# Patient Record
Sex: Female | Born: 1955 | Marital: Single
Health system: Southern US, Community
[De-identification: ages and names within clinical notes are randomized; demographics above are authoritative.]

---

## 2013-03-09 ENCOUNTER — Emergency Department: Payer: Self-pay

## 2013-03-10 LAB — COMPREHENSIVE METABOLIC PANEL
Albumin: 3.8 g/dL (ref 3.4–5.0)
Calcium, Total: 9.4 mg/dL (ref 8.5–10.1)
Chloride: 95 mmol/L — ABNORMAL LOW (ref 98–107)
Co2: 24 mmol/L (ref 21–32)
EGFR (African American): >60
Osmolality: 261 (ref 275–301)
Potassium: 4 mmol/L (ref 3.5–5.1)
SGPT (ALT): 22 U/L (ref 12–78)
Sodium: 129 mmol/L — ABNORMAL LOW (ref 136–145)
Total Protein: 7.6 g/dL (ref 6.4–8.2)

## 2013-03-10 LAB — CBC
HCT: 39 % (ref 35.0–47.0)
MCHC: 35.6 g/dL (ref 32.0–36.0)
MCV: 98 fL (ref 80–100)
RDW: 12.4 % (ref 11.5–14.5)
WBC: 13.9 10*3/uL — ABNORMAL HIGH (ref 3.6–11.0)

## 2013-03-10 LAB — DRUG SCREEN, URINE
Amphetamines, Ur Screen: NEGATIVE (ref ?–1000)
Benzodiazepine, Ur Scrn: NEGATIVE (ref ?–200)
Cannabinoid 50 Ng, Ur ~~LOC~~: NEGATIVE (ref ?–50)
Methadone, Ur Screen: NEGATIVE (ref ?–300)
Phencyclidine (PCP) Ur S: NEGATIVE (ref ?–25)

## 2013-03-10 LAB — ETHANOL: Ethanol: <3 mg/dL

## 2013-03-10 LAB — SALICYLATE LEVEL: Salicylates, Serum: <1.7 mg/dL

## 2013-03-10 LAB — TSH: Thyroid Stimulating Horm: 2.87 u[IU]/mL

## 2013-03-10 LAB — ACETAMINOPHEN LEVEL: Acetaminophen: <2 ug/mL

## 2013-03-24 ENCOUNTER — Emergency Department: Payer: Self-pay | Admitting: Emergency Medicine

## 2013-04-24 ENCOUNTER — Emergency Department: Payer: Self-pay | Admitting: Emergency Medicine

## 2013-04-24 LAB — COMPREHENSIVE METABOLIC PANEL
Albumin: 3.5 g/dL (ref 3.4–5.0)
Alkaline Phosphatase: 69 U/L (ref 50–136)
Anion Gap: 8 (ref 7–16)
BUN: 7 mg/dL (ref 7–18)
Bilirubin,Total: 0.3 mg/dL (ref 0.2–1.0)
Calcium, Total: 9 mg/dL (ref 8.5–10.1)
Chloride: 108 mmol/L — ABNORMAL HIGH (ref 98–107)
Creatinine: 0.69 mg/dL (ref 0.60–1.30)
EGFR (African American): >60
Osmolality: 284 (ref 275–301)
Potassium: 3.1 mmol/L — ABNORMAL LOW (ref 3.5–5.1)
Sodium: 141 mmol/L (ref 136–145)
Total Protein: 6.7 g/dL (ref 6.4–8.2)

## 2013-04-24 LAB — CBC
HCT: 42.2 % (ref 35.0–47.0)
HGB: 14.8 g/dL (ref 12.0–16.0)
MCH: 34 pg (ref 26.0–34.0)
MCHC: 35 g/dL (ref 32.0–36.0)
MCV: 97 fL (ref 80–100)
Platelet: 218 10*3/uL (ref 150–440)

## 2013-04-24 LAB — URINALYSIS, COMPLETE
Bilirubin,UR: NEGATIVE
Blood: NEGATIVE
Ketone: NEGATIVE
Nitrite: NEGATIVE
Protein: NEGATIVE
RBC,UR: 3 /HPF (ref 0–5)
Specific Gravity: 1.005 (ref 1.003–1.030)

## 2013-04-24 LAB — DRUG SCREEN, URINE
Amphetamines, Ur Screen: NEGATIVE (ref ?–1000)
Barbiturates, Ur Screen: NEGATIVE (ref ?–200)
Cocaine Metabolite,Ur ~~LOC~~: NEGATIVE (ref ?–300)
Phencyclidine (PCP) Ur S: NEGATIVE (ref ?–25)

## 2013-04-24 LAB — VALPROIC ACID LEVEL: Valproic Acid: 46 ug/mL — ABNORMAL LOW

## 2013-04-24 LAB — TSH: Thyroid Stimulating Horm: 0.3 u[IU]/mL — ABNORMAL LOW

## 2013-05-11 LAB — DRUG SCREEN, URINE
Cannabinoid 50 Ng, Ur ~~LOC~~: NEGATIVE (ref ?–50)
Cocaine Metabolite,Ur ~~LOC~~: NEGATIVE (ref ?–300)
MDMA (Ecstasy)Ur Screen: NEGATIVE (ref ?–500)
Methadone, Ur Screen: NEGATIVE (ref ?–300)
Opiate, Ur Screen: NEGATIVE (ref ?–300)
Phencyclidine (PCP) Ur S: NEGATIVE (ref ?–25)
Tricyclic, Ur Screen: NEGATIVE (ref ?–1000)

## 2013-05-11 LAB — URINALYSIS, COMPLETE
Glucose,UR: NEGATIVE mg/dL (ref 0–75)
Ketone: NEGATIVE
Nitrite: NEGATIVE
Ph: 6 (ref 4.5–8.0)
RBC,UR: 3 /HPF (ref 0–5)
Squamous Epithelial: 1
WBC UR: 12 /HPF (ref 0–5)

## 2013-05-11 LAB — CBC
HCT: 43.2 % (ref 35.0–47.0)
MCH: 34 pg (ref 26.0–34.0)
MCHC: 35.2 g/dL (ref 32.0–36.0)
MCV: 97 fL (ref 80–100)
RBC: 4.46 10*6/uL (ref 3.80–5.20)

## 2013-05-11 LAB — COMPREHENSIVE METABOLIC PANEL
Alkaline Phosphatase: 68 U/L (ref 50–136)
Anion Gap: 8 (ref 7–16)
BUN: 9 mg/dL (ref 7–18)
Calcium, Total: 9.2 mg/dL (ref 8.5–10.1)
Chloride: 107 mmol/L (ref 98–107)
Creatinine: 0.95 mg/dL (ref 0.60–1.30)
Glucose: 237 mg/dL — ABNORMAL HIGH (ref 65–99)
Potassium: 3.5 mmol/L (ref 3.5–5.1)
SGOT(AST): 21 U/L (ref 15–37)
Total Protein: 6.9 g/dL (ref 6.4–8.2)

## 2013-05-11 LAB — ETHANOL: Ethanol: <3 mg/dL

## 2013-05-13 ENCOUNTER — Inpatient Hospital Stay: Payer: Self-pay | Admitting: Psychiatry

## 2013-05-23 ENCOUNTER — Emergency Department: Payer: Self-pay | Admitting: Emergency Medicine

## 2013-05-23 LAB — CBC
HCT: 39.5 % (ref 35.0–47.0)
MCH: 34.2 pg — ABNORMAL HIGH (ref 26.0–34.0)
MCHC: 35.8 g/dL (ref 32.0–36.0)
MCV: 96 fL (ref 80–100)
RBC: 4.14 10*6/uL (ref 3.80–5.20)
WBC: 10.6 10*3/uL (ref 3.6–11.0)

## 2013-05-24 LAB — COMPREHENSIVE METABOLIC PANEL
Albumin: 3.6 g/dL (ref 3.4–5.0)
Alkaline Phosphatase: 233 U/L — ABNORMAL HIGH (ref 50–136)
Anion Gap: 10 (ref 7–16)
Bilirubin,Total: 0.2 mg/dL (ref 0.2–1.0)
Calcium, Total: 9.4 mg/dL (ref 8.5–10.1)
Chloride: 106 mmol/L (ref 98–107)
Co2: 20 mmol/L — ABNORMAL LOW (ref 21–32)
Creatinine: 1.05 mg/dL (ref 0.60–1.30)
EGFR (Non-African Amer.): 58 — ABNORMAL LOW
Potassium: 4.1 mmol/L (ref 3.5–5.1)

## 2013-05-24 LAB — ACETAMINOPHEN LEVEL: Acetaminophen: <2 ug/mL

## 2013-05-24 LAB — DRUG SCREEN, URINE
Amphetamines, Ur Screen: NEGATIVE (ref ?–1000)
Barbiturates, Ur Screen: NEGATIVE (ref ?–200)
Cannabinoid 50 Ng, Ur ~~LOC~~: NEGATIVE (ref ?–50)
Cocaine Metabolite,Ur ~~LOC~~: NEGATIVE (ref ?–300)
Opiate, Ur Screen: NEGATIVE (ref ?–300)
Phencyclidine (PCP) Ur S: NEGATIVE (ref ?–25)
Tricyclic, Ur Screen: NEGATIVE (ref ?–1000)

## 2013-05-24 LAB — ETHANOL: Ethanol %: <0.003 % (ref 0.000–0.080)

## 2013-05-24 LAB — TSH: Thyroid Stimulating Horm: 6.07 u[IU]/mL — ABNORMAL HIGH

## 2013-06-11 ENCOUNTER — Emergency Department: Payer: Self-pay | Admitting: Emergency Medicine

## 2013-06-11 LAB — COMPREHENSIVE METABOLIC PANEL
Albumin: 3.7 g/dL (ref 3.4–5.0)
Anion Gap: 3 — ABNORMAL LOW (ref 7–16)
BUN: 6 mg/dL — ABNORMAL LOW (ref 7–18)
Calcium, Total: 9.6 mg/dL (ref 8.5–10.1)
Co2: 26 mmol/L (ref 21–32)
Creatinine: 0.83 mg/dL (ref 0.60–1.30)
EGFR (African American): >60
EGFR (Non-African Amer.): >60
Osmolality: 275 (ref 275–301)
Potassium: 3.9 mmol/L (ref 3.5–5.1)
SGPT (ALT): 39 U/L (ref 12–78)
Total Protein: 7.7 g/dL (ref 6.4–8.2)

## 2013-06-11 LAB — URINALYSIS, COMPLETE
Bilirubin,UR: NEGATIVE
Blood: NEGATIVE
Glucose,UR: NEGATIVE mg/dL (ref 0–75)
Ph: 6 (ref 4.5–8.0)
RBC,UR: <1 /HPF (ref 0–5)
Specific Gravity: 1.003 (ref 1.003–1.030)
Squamous Epithelial: <1
WBC UR: 2 /HPF (ref 0–5)

## 2013-06-11 LAB — DRUG SCREEN, URINE
Amphetamines, Ur Screen: NEGATIVE (ref ?–1000)
Cannabinoid 50 Ng, Ur ~~LOC~~: NEGATIVE (ref ?–50)
Cocaine Metabolite,Ur ~~LOC~~: NEGATIVE (ref ?–300)
MDMA (Ecstasy)Ur Screen: NEGATIVE (ref ?–500)
Methadone, Ur Screen: NEGATIVE (ref ?–300)
Opiate, Ur Screen: NEGATIVE (ref ?–300)
Phencyclidine (PCP) Ur S: NEGATIVE (ref ?–25)
Tricyclic, Ur Screen: NEGATIVE (ref ?–1000)

## 2013-06-11 LAB — ETHANOL
Ethanol %: <0.003 % (ref 0.000–0.080)
Ethanol: <3 mg/dL

## 2013-06-11 LAB — CBC
HCT: 40.6 % (ref 35.0–47.0)
HGB: 14.7 g/dL (ref 12.0–16.0)
MCV: 94 fL (ref 80–100)
Platelet: 319 10*3/uL (ref 150–440)
WBC: 9.9 10*3/uL (ref 3.6–11.0)

## 2013-06-11 LAB — SALICYLATE LEVEL: Salicylates, Serum: 7 mg/dL — ABNORMAL HIGH

## 2013-06-11 LAB — TSH: Thyroid Stimulating Horm: 2.81 u[IU]/mL

## 2013-07-18 LAB — URINALYSIS, COMPLETE
Ketone: NEGATIVE
Nitrite: NEGATIVE
Ph: 5 (ref 4.5–8.0)
Protein: NEGATIVE
Specific Gravity: 1.009 (ref 1.003–1.030)
Squamous Epithelial: 2

## 2013-07-18 LAB — CBC
HCT: 44 % (ref 35.0–47.0)
HGB: 15 g/dL (ref 12.0–16.0)
MCH: 32.1 pg (ref 26.0–34.0)
RBC: 4.68 10*6/uL (ref 3.80–5.20)
RDW: 13.2 % (ref 11.5–14.5)

## 2013-07-18 LAB — COMPREHENSIVE METABOLIC PANEL
Albumin: 3.7 g/dL (ref 3.4–5.0)
Alkaline Phosphatase: 89 U/L
Anion Gap: 7 (ref 7–16)
BUN: 13 mg/dL (ref 7–18)
Calcium, Total: 9.5 mg/dL (ref 8.5–10.1)
Chloride: 109 mmol/L — ABNORMAL HIGH (ref 98–107)
Creatinine: 0.82 mg/dL (ref 0.60–1.30)
EGFR (African American): >60
EGFR (Non-African Amer.): >60
SGOT(AST): 18 U/L (ref 15–37)
Total Protein: 7.3 g/dL (ref 6.4–8.2)

## 2013-07-18 LAB — ETHANOL: Ethanol: <3 mg/dL

## 2013-07-18 LAB — DRUG SCREEN, URINE
Amphetamines, Ur Screen: NEGATIVE (ref ?–1000)
Barbiturates, Ur Screen: NEGATIVE (ref ?–200)
Cocaine Metabolite,Ur ~~LOC~~: NEGATIVE (ref ?–300)
MDMA (Ecstasy)Ur Screen: NEGATIVE (ref ?–500)
Methadone, Ur Screen: NEGATIVE (ref ?–300)
Phencyclidine (PCP) Ur S: NEGATIVE (ref ?–25)
Tricyclic, Ur Screen: NEGATIVE (ref ?–1000)

## 2013-07-18 LAB — ACETAMINOPHEN LEVEL: Acetaminophen: <2 ug/mL

## 2013-07-18 LAB — LIPASE, BLOOD: Lipase: 393 U/L (ref 73–393)

## 2013-07-20 ENCOUNTER — Inpatient Hospital Stay: Payer: Self-pay | Admitting: Psychiatry

## 2013-07-26 LAB — DIFFERENTIAL
Basophil %: 0.8 %
Eosinophil #: 0.3 10*3/uL (ref 0.0–0.7)
Eosinophil %: 2.6 %
Lymphocyte #: 4.8 10*3/uL — ABNORMAL HIGH (ref 1.0–3.6)
Lymphocyte %: 41.7 %
Neutrophil #: 5.7 10*3/uL (ref 1.4–6.5)

## 2013-07-26 LAB — WBC: WBC: 11.5 10*3/uL — ABNORMAL HIGH (ref 3.6–11.0)

## 2013-07-30 LAB — VALPROIC ACID LEVEL: Valproic Acid: 72 ug/mL

## 2013-07-30 LAB — AMMONIA: Ammonia, Plasma: 39 mcmol/L — ABNORMAL HIGH (ref 11–32)

## 2013-08-02 LAB — DIFFERENTIAL
Basophil #: 0.1 10*3/uL (ref 0.0–0.1)
Basophil %: 0.9 %
Eosinophil #: 0.5 10*3/uL (ref 0.0–0.7)
Lymphocyte #: 3.9 10*3/uL — ABNORMAL HIGH (ref 1.0–3.6)
Lymphocyte %: 35.3 %
Monocyte #: 0.7 x10 3/mm (ref 0.2–0.9)
Neutrophil %: 52.9 %

## 2013-08-02 LAB — TSH: Thyroid Stimulating Horm: 1.41 u[IU]/mL

## 2013-08-02 LAB — WBC: WBC: 11.1 10*3/uL — ABNORMAL HIGH (ref 3.6–11.0)

## 2013-08-02 LAB — HEMOGLOBIN A1C: Hemoglobin A1C: 7.6 % — ABNORMAL HIGH (ref 4.2–6.3)

## 2013-08-03 LAB — CBC WITH DIFFERENTIAL/PLATELET
Basophil #: 0.1 10*3/uL (ref 0.0–0.1)
Basophil %: 1.3 %
Eosinophil #: 0.4 10*3/uL (ref 0.0–0.7)
Eosinophil %: 3.8 %
HGB: 14.6 g/dL (ref 12.0–16.0)
Lymphocyte %: 32.4 %
Monocyte #: 0.7 x10 3/mm (ref 0.2–0.9)
Monocyte %: 6.3 %
Neutrophil #: 5.8 10*3/uL (ref 1.4–6.5)
Neutrophil %: 56.2 %
Platelet: 253 10*3/uL (ref 150–440)
WBC: 10.4 10*3/uL (ref 3.6–11.0)

## 2013-08-03 LAB — BASIC METABOLIC PANEL
BUN: 16 mg/dL (ref 7–18)
Calcium, Total: 9.5 mg/dL (ref 8.5–10.1)
Chloride: 95 mmol/L — ABNORMAL LOW (ref 98–107)
Creatinine: 0.88 mg/dL (ref 0.60–1.30)
EGFR (African American): >60
Glucose: 234 mg/dL — ABNORMAL HIGH (ref 65–99)
Osmolality: 264 (ref 275–301)
Potassium: 4.4 mmol/L (ref 3.5–5.1)

## 2013-08-03 LAB — MAGNESIUM: Magnesium: 1.4 mg/dL — ABNORMAL LOW

## 2013-08-05 LAB — COMPREHENSIVE METABOLIC PANEL
Albumin: 3.3 g/dL — ABNORMAL LOW (ref 3.4–5.0)
Alkaline Phosphatase: 87 U/L
Anion Gap: 5 — ABNORMAL LOW (ref 7–16)
BUN: 13 mg/dL (ref 7–18)
Bilirubin,Total: 0.2 mg/dL (ref 0.2–1.0)
Chloride: 99 mmol/L (ref 98–107)
Co2: 29 mmol/L (ref 21–32)
Creatinine: 0.88 mg/dL (ref 0.60–1.30)
EGFR (Non-African Amer.): >60
Osmolality: 273 (ref 275–301)
Potassium: 4.6 mmol/L (ref 3.5–5.1)
SGOT(AST): 14 U/L — ABNORMAL LOW (ref 15–37)
SGPT (ALT): 27 U/L (ref 12–78)
Sodium: 133 mmol/L — ABNORMAL LOW (ref 136–145)
Total Protein: 6 g/dL — ABNORMAL LOW (ref 6.4–8.2)

## 2013-08-05 LAB — AMMONIA: Ammonia, Plasma: 44 mcmol/L — ABNORMAL HIGH (ref 11–32)

## 2013-08-05 LAB — VALPROIC ACID LEVEL: Valproic Acid: 68 ug/mL

## 2013-08-27 ENCOUNTER — Inpatient Hospital Stay: Payer: Self-pay | Admitting: Internal Medicine

## 2013-08-27 DIAGNOSIS — R509 Fever, unspecified: Secondary | ICD-10-CM

## 2013-08-27 LAB — COMPREHENSIVE METABOLIC PANEL
ALBUMIN: 3.1 g/dL — AB (ref 3.4–5.0)
ANION GAP: 13 (ref 7–16)
AST: 27 U/L (ref 15–37)
Alkaline Phosphatase: 65 U/L
BUN: 23 mg/dL — AB (ref 7–18)
Bilirubin,Total: 0.3 mg/dL (ref 0.2–1.0)
CREATININE: 1.56 mg/dL — AB (ref 0.60–1.30)
Calcium, Total: 9.2 mg/dL (ref 8.5–10.1)
Chloride: 104 mmol/L (ref 98–107)
Co2: 21 mmol/L (ref 21–32)
EGFR (African American): 42 — ABNORMAL LOW
EGFR (Non-African Amer.): 36 — ABNORMAL LOW
Glucose: 186 mg/dL — ABNORMAL HIGH (ref 65–99)
OSMOLALITY: 284 (ref 275–301)
Potassium: 4 mmol/L (ref 3.5–5.1)
SGPT (ALT): 17 U/L (ref 12–78)
Sodium: 138 mmol/L (ref 136–145)
Total Protein: 6.8 g/dL (ref 6.4–8.2)

## 2013-08-27 LAB — CBC
HCT: 39 % (ref 35.0–47.0)
HGB: 13.4 g/dL (ref 12.0–16.0)
MCH: 32.5 pg (ref 26.0–34.0)
MCHC: 34.4 g/dL (ref 32.0–36.0)
MCV: 94 fL (ref 80–100)
PLATELETS: 209 10*3/uL (ref 150–440)
RBC: 4.13 10*6/uL (ref 3.80–5.20)
RDW: 13.7 % (ref 11.5–14.5)
WBC: 8 10*3/uL (ref 3.6–11.0)

## 2013-08-27 LAB — URINALYSIS, COMPLETE
Bacteria: NONE SEEN
Bilirubin,UR: NEGATIVE
Blood: NEGATIVE
GLUCOSE, UR: NEGATIVE mg/dL (ref 0–75)
Hyaline Cast: 6
Leukocyte Esterase: NEGATIVE
NITRITE: NEGATIVE
Ph: 5 (ref 4.5–8.0)
Protein: NEGATIVE
SPECIFIC GRAVITY: 1.016 (ref 1.003–1.030)
Squamous Epithelial: <1

## 2013-08-27 LAB — DRUG SCREEN, URINE
Amphetamines, Ur Screen: NEGATIVE (ref ?–1000)
BENZODIAZEPINE, UR SCRN: NEGATIVE (ref ?–200)
Barbiturates, Ur Screen: NEGATIVE (ref ?–200)
COCAINE METABOLITE, UR ~~LOC~~: NEGATIVE (ref ?–300)
Cannabinoid 50 Ng, Ur ~~LOC~~: NEGATIVE (ref ?–50)
MDMA (Ecstasy)Ur Screen: NEGATIVE (ref ?–500)
Methadone, Ur Screen: NEGATIVE (ref ?–300)
Opiate, Ur Screen: NEGATIVE (ref ?–300)
Phencyclidine (PCP) Ur S: NEGATIVE (ref ?–25)
Tricyclic, Ur Screen: NEGATIVE (ref ?–1000)

## 2013-08-27 LAB — HEMOGLOBIN A1C: Hemoglobin A1C: 7.8 % — ABNORMAL HIGH (ref 4.2–6.3)

## 2013-08-27 LAB — TSH: Thyroid Stimulating Horm: 7.89 u[IU]/mL — ABNORMAL HIGH

## 2013-08-27 LAB — ACETAMINOPHEN LEVEL

## 2013-08-27 LAB — CK-MB
CK-MB: 3.1 ng/mL (ref 0.5–3.6)
CK-MB: 4.1 ng/mL — AB (ref 0.5–3.6)
CK-MB: 3.1 ng/mL (ref 0.5–3.6)

## 2013-08-27 LAB — TROPONIN I
TROPONIN-I: 0.03 ng/mL
Troponin-I: <0.02 ng/mL

## 2013-08-27 LAB — SALICYLATE LEVEL: Salicylates, Serum: 4.2 mg/dL — ABNORMAL HIGH

## 2013-08-27 LAB — PROTIME-INR
INR: 1.1
Prothrombin Time: 13.9 secs (ref 11.5–14.7)

## 2013-08-27 LAB — VALPROIC ACID LEVEL: VALPROIC ACID: 81 ug/mL

## 2013-08-27 LAB — RAPID INFLUENZA A&B ANTIGENS

## 2013-08-28 LAB — COMPREHENSIVE METABOLIC PANEL
ALBUMIN: 2.1 g/dL — AB (ref 3.4–5.0)
AST: 24 U/L (ref 15–37)
Alkaline Phosphatase: 45 U/L
Anion Gap: 8 (ref 7–16)
BILIRUBIN TOTAL: 0.4 mg/dL (ref 0.2–1.0)
BUN: 15 mg/dL (ref 7–18)
CHLORIDE: 109 mmol/L — AB (ref 98–107)
CREATININE: 0.97 mg/dL (ref 0.60–1.30)
Calcium, Total: 7.5 mg/dL — ABNORMAL LOW (ref 8.5–10.1)
Co2: 23 mmol/L (ref 21–32)
EGFR (Non-African Amer.): >60
GLUCOSE: 167 mg/dL — AB (ref 65–99)
Osmolality: 284 (ref 275–301)
POTASSIUM: 3.6 mmol/L (ref 3.5–5.1)
SGPT (ALT): 13 U/L (ref 12–78)
SODIUM: 140 mmol/L (ref 136–145)
Total Protein: 5.2 g/dL — ABNORMAL LOW (ref 6.4–8.2)

## 2013-08-28 LAB — CBC WITH DIFFERENTIAL/PLATELET
Basophil #: 0.1 10*3/uL (ref 0.0–0.1)
Basophil %: 0.6 %
EOS PCT: 0.4 %
Eosinophil #: 0 10*3/uL (ref 0.0–0.7)
HCT: 30.8 % — ABNORMAL LOW (ref 35.0–47.0)
HGB: 10.6 g/dL — AB (ref 12.0–16.0)
Lymphocyte #: 1.6 10*3/uL (ref 1.0–3.6)
Lymphocyte %: 14.5 %
MCH: 32.7 pg (ref 26.0–34.0)
MCHC: 34.4 g/dL (ref 32.0–36.0)
MCV: 95 fL (ref 80–100)
MONOS PCT: 4 %
Monocyte #: 0.4 x10 3/mm (ref 0.2–0.9)
NEUTROS ABS: 8.7 10*3/uL — AB (ref 1.4–6.5)
NEUTROS PCT: 80.5 %
PLATELETS: 159 10*3/uL (ref 150–440)
RBC: 3.25 10*6/uL — ABNORMAL LOW (ref 3.80–5.20)
RDW: 13.7 % (ref 11.5–14.5)
WBC: 10.9 10*3/uL (ref 3.6–11.0)

## 2013-08-28 LAB — T4, FREE: Free Thyroxine: 0.6 ng/dL — ABNORMAL LOW (ref 0.76–1.46)

## 2013-08-28 LAB — AMMONIA: Ammonia, Plasma: 19 mcmol/L (ref 11–32)

## 2013-08-29 LAB — PHOSPHORUS
PHOSPHORUS: 1.6 mg/dL — AB (ref 2.5–4.9)
Phosphorus: 2.7 mg/dL (ref 2.5–4.9)

## 2013-08-29 LAB — MAGNESIUM
MAGNESIUM: 1.3 mg/dL — AB
MAGNESIUM: 1.7 mg/dL — AB

## 2013-08-30 LAB — CBC WITH DIFFERENTIAL/PLATELET
BANDS NEUTROPHIL: 8 %
Comment - H1-Com1: NORMAL
Eosinophil: 7 %
HCT: 28.7 % — AB (ref 35.0–47.0)
HGB: 9.9 g/dL — ABNORMAL LOW (ref 12.0–16.0)
Lymphocytes: 29 %
MCH: 32.9 pg (ref 26.0–34.0)
MCHC: 34.3 g/dL (ref 32.0–36.0)
MCV: 96 fL (ref 80–100)
Metamyelocyte: 2 %
Monocytes: 2 %
Platelet: 143 10*3/uL — ABNORMAL LOW (ref 150–440)
RBC: 3 10*6/uL — ABNORMAL LOW (ref 3.80–5.20)
RDW: 13.7 % (ref 11.5–14.5)
Segmented Neutrophils: 52 %
WBC: 7.8 10*3/uL (ref 3.6–11.0)

## 2013-08-30 LAB — BASIC METABOLIC PANEL
Anion Gap: 4 — ABNORMAL LOW (ref 7–16)
BUN: 5 mg/dL — AB (ref 7–18)
Calcium, Total: 7.2 mg/dL — ABNORMAL LOW (ref 8.5–10.1)
Chloride: 114 mmol/L — ABNORMAL HIGH (ref 98–107)
Co2: 24 mmol/L (ref 21–32)
Creatinine: 0.65 mg/dL (ref 0.60–1.30)
EGFR (African American): >60
EGFR (Non-African Amer.): >60
GLUCOSE: 223 mg/dL — AB (ref 65–99)
Osmolality: 287 (ref 275–301)
Potassium: 4 mmol/L (ref 3.5–5.1)
SODIUM: 142 mmol/L (ref 136–145)

## 2013-08-30 LAB — MAGNESIUM: Magnesium: 1.6 mg/dL — ABNORMAL LOW

## 2013-08-30 LAB — VALPROIC ACID LEVEL: VALPROIC ACID: 45 ug/mL — AB

## 2013-08-30 LAB — PHOSPHORUS: Phosphorus: 1.5 mg/dL — ABNORMAL LOW (ref 2.5–4.9)

## 2013-08-31 LAB — CBC WITH DIFFERENTIAL/PLATELET
Basophil #: 0 10*3/uL (ref 0.0–0.1)
Basophil %: 0.4 %
EOS ABS: 0.7 10*3/uL (ref 0.0–0.7)
Eosinophil %: 7.3 %
HCT: 29.8 % — AB (ref 35.0–47.0)
HGB: 10.2 g/dL — ABNORMAL LOW (ref 12.0–16.0)
LYMPHS PCT: 19.3 %
Lymphocyte #: 1.8 10*3/uL (ref 1.0–3.6)
MCH: 32.9 pg (ref 26.0–34.0)
MCHC: 34.1 g/dL (ref 32.0–36.0)
MCV: 96 fL (ref 80–100)
MONOS PCT: 9.6 %
Monocyte #: 0.9 x10 3/mm (ref 0.2–0.9)
NEUTROS PCT: 63.4 %
Neutrophil #: 5.9 10*3/uL (ref 1.4–6.5)
Platelet: 169 10*3/uL (ref 150–440)
RBC: 3.1 10*6/uL — ABNORMAL LOW (ref 3.80–5.20)
RDW: 14.1 % (ref 11.5–14.5)
WBC: 9.3 10*3/uL (ref 3.6–11.0)

## 2013-08-31 LAB — BASIC METABOLIC PANEL
Anion Gap: 4 — ABNORMAL LOW (ref 7–16)
BUN: 4 mg/dL — ABNORMAL LOW (ref 7–18)
CALCIUM: 7.8 mg/dL — AB (ref 8.5–10.1)
CHLORIDE: 116 mmol/L — AB (ref 98–107)
Co2: 26 mmol/L (ref 21–32)
Creatinine: 0.74 mg/dL (ref 0.60–1.30)
EGFR (African American): >60
Glucose: 197 mg/dL — ABNORMAL HIGH (ref 65–99)
OSMOLALITY: 293 (ref 275–301)
Potassium: 4.5 mmol/L (ref 3.5–5.1)
Sodium: 146 mmol/L — ABNORMAL HIGH (ref 136–145)

## 2013-08-31 LAB — VANCOMYCIN, TROUGH: VANCOMYCIN, TROUGH: 8 ug/mL — AB (ref 10–20)

## 2013-08-31 LAB — MAGNESIUM: MAGNESIUM: 1.9 mg/dL

## 2013-08-31 LAB — PHOSPHORUS: Phosphorus: 2.7 mg/dL (ref 2.5–4.9)

## 2013-09-01 LAB — CBC WITH DIFFERENTIAL/PLATELET
Bands: 9 %
COMMENT - H1-COM1: NORMAL
Comment - H1-Com2: NORMAL
HCT: 31.1 % — ABNORMAL LOW (ref 35.0–47.0)
HGB: 10.2 g/dL — AB (ref 12.0–16.0)
LYMPHS PCT: 14 %
MCH: 31.7 pg (ref 26.0–34.0)
MCHC: 32.7 g/dL (ref 32.0–36.0)
MCV: 97 fL (ref 80–100)
METAMYELOCYTE: 3 %
MONOS PCT: 3 %
MYELOCYTE: 5 %
Platelet: 161 10*3/uL (ref 150–440)
RBC: 3.21 10*6/uL — AB (ref 3.80–5.20)
RDW: 14.2 % (ref 11.5–14.5)
SEGMENTED NEUTROPHILS: 66 %
WBC: 10.8 10*3/uL (ref 3.6–11.0)

## 2013-09-01 LAB — BASIC METABOLIC PANEL
Anion Gap: 4 — ABNORMAL LOW (ref 7–16)
BUN: 7 mg/dL (ref 7–18)
CHLORIDE: 119 mmol/L — AB (ref 98–107)
CO2: 27 mmol/L (ref 21–32)
Calcium, Total: 8 mg/dL — ABNORMAL LOW (ref 8.5–10.1)
Creatinine: 0.62 mg/dL (ref 0.60–1.30)
EGFR (African American): >60
EGFR (Non-African Amer.): >60
GLUCOSE: 243 mg/dL — AB (ref 65–99)
OSMOLALITY: 304 (ref 275–301)
POTASSIUM: 4.4 mmol/L (ref 3.5–5.1)
SODIUM: 150 mmol/L — AB (ref 136–145)

## 2013-09-01 LAB — MAGNESIUM: Magnesium: 1.8 mg/dL

## 2013-09-01 LAB — TRIGLYCERIDES: Triglycerides: 109 mg/dL (ref 0–200)

## 2013-09-01 LAB — CULTURE, BLOOD (SINGLE)

## 2013-09-01 LAB — PHOSPHORUS: Phosphorus: 2.8 mg/dL (ref 2.5–4.9)

## 2013-09-02 LAB — BASIC METABOLIC PANEL
Anion Gap: 3 — ABNORMAL LOW (ref 7–16)
BUN: 12 mg/dL (ref 7–18)
CALCIUM: 8.6 mg/dL (ref 8.5–10.1)
CHLORIDE: 114 mmol/L — AB (ref 98–107)
CREATININE: 0.71 mg/dL (ref 0.60–1.30)
Co2: 28 mmol/L (ref 21–32)
EGFR (Non-African Amer.): >60
Glucose: 184 mg/dL — ABNORMAL HIGH (ref 65–99)
Osmolality: 293 (ref 275–301)
POTASSIUM: 4.4 mmol/L (ref 3.5–5.1)
SODIUM: 145 mmol/L (ref 136–145)

## 2013-09-02 LAB — MAGNESIUM: Magnesium: 1.7 mg/dL — ABNORMAL LOW

## 2013-09-02 LAB — EXPECTORATED SPUTUM ASSESSMENT W GRAM STAIN, RFLX TO RESP C

## 2013-09-02 LAB — VALPROIC ACID LEVEL: Valproic Acid: 26 ug/mL — ABNORMAL LOW

## 2013-09-02 LAB — PHENYTOIN LEVEL, TOTAL: Dilantin: 3.3 ug/mL — ABNORMAL LOW (ref 10.0–20.0)

## 2013-09-02 LAB — PHOSPHORUS: PHOSPHORUS: 1.8 mg/dL — AB (ref 2.5–4.9)

## 2013-09-03 LAB — PHOSPHORUS: PHOSPHORUS: 3.3 mg/dL (ref 2.5–4.9)

## 2013-09-03 LAB — BASIC METABOLIC PANEL
Anion Gap: 2 — ABNORMAL LOW (ref 7–16)
BUN: 24 mg/dL — ABNORMAL HIGH (ref 7–18)
CHLORIDE: 108 mmol/L — AB (ref 98–107)
CO2: 30 mmol/L (ref 21–32)
Creatinine: 0.76 mg/dL (ref 0.60–1.30)
EGFR (African American): >60
EGFR (Non-African Amer.): >60
Glucose: 167 mg/dL — ABNORMAL HIGH (ref 65–99)
Osmolality: 287 (ref 275–301)

## 2013-09-03 LAB — URINE CULTURE

## 2013-09-03 LAB — CALCIUM: Calcium, Total: 8.7 mg/dL (ref 8.5–10.1)

## 2013-09-03 LAB — VALPROIC ACID LEVEL: Valproic Acid: 32 ug/mL — ABNORMAL LOW

## 2013-09-03 LAB — SODIUM: Sodium: 140 mmol/L (ref 136–145)

## 2013-09-03 LAB — POTASSIUM: POTASSIUM: 4.4 mmol/L (ref 3.5–5.1)

## 2013-09-03 LAB — MAGNESIUM: MAGNESIUM: 1.9 mg/dL

## 2013-09-04 LAB — MAGNESIUM: Magnesium: 1.7 mg/dL — ABNORMAL LOW

## 2013-09-04 LAB — BASIC METABOLIC PANEL
ANION GAP: 4 — AB (ref 7–16)
BUN: 32 mg/dL — ABNORMAL HIGH (ref 7–18)
CHLORIDE: 104 mmol/L (ref 98–107)
CREATININE: 0.72 mg/dL (ref 0.60–1.30)
Co2: 30 mmol/L (ref 21–32)
EGFR (African American): >60
EGFR (Non-African Amer.): >60
GLUCOSE: 170 mg/dL — AB (ref 65–99)
Osmolality: 287 (ref 275–301)

## 2013-09-04 LAB — SODIUM: Sodium: 138 mmol/L (ref 136–145)

## 2013-09-04 LAB — PHOSPHORUS: Phosphorus: 3.9 mg/dL (ref 2.5–4.9)

## 2013-09-04 LAB — CALCIUM: Calcium, Total: 9 mg/dL (ref 8.5–10.1)

## 2013-09-04 LAB — POTASSIUM: Potassium: 3.9 mmol/L (ref 3.5–5.1)

## 2013-09-04 LAB — CLOSTRIDIUM DIFFICILE(ARMC)

## 2013-09-05 LAB — BASIC METABOLIC PANEL
Anion Gap: 5 — ABNORMAL LOW (ref 7–16)
BUN: 31 mg/dL — ABNORMAL HIGH (ref 7–18)
Calcium, Total: 9.7 mg/dL (ref 8.5–10.1)
Chloride: 104 mmol/L (ref 98–107)
Co2: 31 mmol/L (ref 21–32)
Creatinine: 0.76 mg/dL (ref 0.60–1.30)
EGFR (Non-African Amer.): >60
Glucose: 90 mg/dL (ref 65–99)
Osmolality: 285 (ref 275–301)
Potassium: 3.3 mmol/L — ABNORMAL LOW (ref 3.5–5.1)
Sodium: 140 mmol/L (ref 136–145)

## 2013-09-05 LAB — DIFFERENTIAL
BASOS ABS: 0.4 10*3/uL — AB (ref 0.0–0.1)
Basophil %: 1.3 %
Eosinophil #: 0.1 10*3/uL (ref 0.0–0.7)
Eosinophil %: 0.2 %
Lymphocyte #: 5.8 10*3/uL — ABNORMAL HIGH (ref 1.0–3.6)
Lymphocyte %: 19.8 %
MONOS PCT: 7.2 %
Monocyte #: 2.1 x10 3/mm — ABNORMAL HIGH (ref 0.2–0.9)
NEUTROS PCT: 71.5 %
Neutrophil #: 21 10*3/uL — ABNORMAL HIGH (ref 1.4–6.5)

## 2013-09-05 LAB — WBC: WBC: 28.9 10*3/uL — ABNORMAL HIGH (ref 3.6–11.0)

## 2013-09-05 LAB — CULTURE, BLOOD (SINGLE)

## 2013-09-05 LAB — MAGNESIUM: Magnesium: 1.7 mg/dL — ABNORMAL LOW

## 2013-09-05 LAB — PHOSPHORUS: PHOSPHORUS: 3 mg/dL (ref 2.5–4.9)

## 2013-09-06 LAB — BASIC METABOLIC PANEL
Anion Gap: 5 — ABNORMAL LOW (ref 7–16)
BUN: 23 mg/dL — AB (ref 7–18)
CALCIUM: 9.5 mg/dL (ref 8.5–10.1)
CREATININE: 0.69 mg/dL (ref 0.60–1.30)
Chloride: 104 mmol/L (ref 98–107)
Co2: 31 mmol/L (ref 21–32)
EGFR (African American): >60
GLUCOSE: 107 mg/dL — AB (ref 65–99)
Osmolality: 284 (ref 275–301)
Potassium: 3.6 mmol/L (ref 3.5–5.1)
Sodium: 140 mmol/L (ref 136–145)

## 2013-09-06 LAB — PHOSPHORUS: Phosphorus: 2.5 mg/dL (ref 2.5–4.9)

## 2013-09-06 LAB — MAGNESIUM: Magnesium: 1.7 mg/dL — ABNORMAL LOW

## 2013-09-06 LAB — VALPROIC ACID LEVEL: Valproic Acid: 40 ug/mL — ABNORMAL LOW

## 2013-09-07 LAB — CBC WITH DIFFERENTIAL/PLATELET
Bands: 1 %
Comment - H1-Com1: NORMAL
HCT: 35.6 % (ref 35.0–47.0)
HGB: 12.2 g/dL (ref 12.0–16.0)
LYMPHS PCT: 38 %
MCH: 32.5 pg (ref 26.0–34.0)
MCHC: 34.2 g/dL (ref 32.0–36.0)
MCV: 95 fL (ref 80–100)
MYELOCYTE: 2 %
Monocytes: 4 %
Platelet: 399 10*3/uL (ref 150–440)
RBC: 3.74 10*6/uL — ABNORMAL LOW (ref 3.80–5.20)
RDW: 13.6 % (ref 11.5–14.5)
Segmented Neutrophils: 55 %
WBC: 16.2 10*3/uL — ABNORMAL HIGH (ref 3.6–11.0)

## 2013-09-07 LAB — BASIC METABOLIC PANEL
Anion Gap: 2 — ABNORMAL LOW (ref 7–16)
BUN: 24 mg/dL — AB (ref 7–18)
CHLORIDE: 104 mmol/L (ref 98–107)
CREATININE: 0.7 mg/dL (ref 0.60–1.30)
Calcium, Total: 9 mg/dL (ref 8.5–10.1)
Co2: 34 mmol/L — ABNORMAL HIGH (ref 21–32)
EGFR (Non-African Amer.): >60
Glucose: 71 mg/dL (ref 65–99)
OSMOLALITY: 282 (ref 275–301)
Potassium: 3.6 mmol/L (ref 3.5–5.1)
Sodium: 140 mmol/L (ref 136–145)

## 2013-09-07 LAB — MAGNESIUM: MAGNESIUM: 1.8 mg/dL

## 2013-09-09 LAB — CBC WITH DIFFERENTIAL/PLATELET
Basophil #: 0.1 10*3/uL (ref 0.0–0.1)
Basophil %: 1 %
EOS ABS: 0 10*3/uL (ref 0.0–0.7)
EOS PCT: 0.3 %
HCT: 39.5 % (ref 35.0–47.0)
HGB: 13.4 g/dL (ref 12.0–16.0)
Lymphocyte #: 3.3 10*3/uL (ref 1.0–3.6)
Lymphocyte %: 24.6 %
MCH: 32.4 pg (ref 26.0–34.0)
MCHC: 33.9 g/dL (ref 32.0–36.0)
MCV: 96 fL (ref 80–100)
MONO ABS: 0.8 x10 3/mm (ref 0.2–0.9)
MONOS PCT: 5.7 %
Neutrophil #: 9.1 10*3/uL — ABNORMAL HIGH (ref 1.4–6.5)
Neutrophil %: 68.4 %
PLATELETS: 347 10*3/uL (ref 150–440)
RBC: 4.13 10*6/uL (ref 3.80–5.20)
RDW: 14.4 % (ref 11.5–14.5)
WBC: 13.3 10*3/uL — AB (ref 3.6–11.0)

## 2013-09-09 LAB — PRO B NATRIURETIC PEPTIDE: B-Type Natriuretic Peptide: 60 pg/mL (ref 0–125)

## 2013-09-10 LAB — BASIC METABOLIC PANEL
Anion Gap: 6 — ABNORMAL LOW (ref 7–16)
BUN: 19 mg/dL — AB (ref 7–18)
CHLORIDE: 100 mmol/L (ref 98–107)
CREATININE: 0.63 mg/dL (ref 0.60–1.30)
Calcium, Total: 10.1 mg/dL (ref 8.5–10.1)
Co2: 29 mmol/L (ref 21–32)
EGFR (African American): >60
EGFR (Non-African Amer.): >60
Glucose: 189 mg/dL — ABNORMAL HIGH (ref 65–99)
OSMOLALITY: 277 (ref 275–301)
Potassium: 4 mmol/L (ref 3.5–5.1)
Sodium: 135 mmol/L — ABNORMAL LOW (ref 136–145)

## 2013-09-12 LAB — DIFFERENTIAL
Basophil #: 0.2 10*3/uL — ABNORMAL HIGH (ref 0.0–0.1)
Basophil %: 1.4 %
EOS ABS: 0.2 10*3/uL (ref 0.0–0.7)
EOS PCT: 1.2 %
Lymphocyte #: 4 10*3/uL — ABNORMAL HIGH (ref 1.0–3.6)
Lymphocyte %: 28.9 %
MONOS PCT: 4.2 %
Monocyte #: 0.6 x10 3/mm (ref 0.2–0.9)
NEUTROS PCT: 64.3 %
Neutrophil #: 8.8 10*3/uL — ABNORMAL HIGH (ref 1.4–6.5)

## 2013-09-12 LAB — WBC: WBC: 13.7 10*3/uL — ABNORMAL HIGH (ref 3.6–11.0)

## 2013-09-13 LAB — BASIC METABOLIC PANEL
ANION GAP: 7 (ref 7–16)
BUN: 20 mg/dL — AB (ref 7–18)
CHLORIDE: 99 mmol/L (ref 98–107)
CO2: 27 mmol/L (ref 21–32)
CREATININE: 0.87 mg/dL (ref 0.60–1.30)
Calcium, Total: 9.6 mg/dL (ref 8.5–10.1)
EGFR (African American): >60
Glucose: 236 mg/dL — ABNORMAL HIGH (ref 65–99)
OSMOLALITY: 277 (ref 275–301)
POTASSIUM: 4.8 mmol/L (ref 3.5–5.1)
Sodium: 133 mmol/L — ABNORMAL LOW (ref 136–145)

## 2013-10-19 ENCOUNTER — Emergency Department: Payer: Self-pay | Admitting: Emergency Medicine

## 2013-10-19 LAB — CBC
HCT: 38.8 % (ref 35.0–47.0)
HGB: 13.2 g/dL (ref 12.0–16.0)
MCH: 32.8 pg (ref 26.0–34.0)
MCHC: 34.1 g/dL (ref 32.0–36.0)
MCV: 96 fL (ref 80–100)
Platelet: 246 10*3/uL (ref 150–440)
RBC: 4.03 10*6/uL (ref 3.80–5.20)
RDW: 15.4 % — ABNORMAL HIGH (ref 11.5–14.5)
WBC: 13.5 10*3/uL — AB (ref 3.6–11.0)

## 2013-10-19 LAB — URINALYSIS, COMPLETE
Bilirubin,UR: NEGATIVE
Blood: NEGATIVE
GLUCOSE, UR: NEGATIVE mg/dL (ref 0–75)
Ketone: NEGATIVE
Nitrite: NEGATIVE
Ph: 7 (ref 4.5–8.0)
Protein: NEGATIVE
RBC,UR: 1 /HPF (ref 0–5)
Specific Gravity: 1.009 (ref 1.003–1.030)
Squamous Epithelial: 3
WBC UR: 4 /HPF (ref 0–5)

## 2013-10-19 LAB — DRUG SCREEN, URINE
Amphetamines, Ur Screen: NEGATIVE (ref ?–1000)
Barbiturates, Ur Screen: NEGATIVE (ref ?–200)
Benzodiazepine, Ur Scrn: NEGATIVE (ref ?–200)
CANNABINOID 50 NG, UR ~~LOC~~: NEGATIVE (ref ?–50)
COCAINE METABOLITE, UR ~~LOC~~: NEGATIVE (ref ?–300)
MDMA (Ecstasy)Ur Screen: NEGATIVE (ref ?–500)
Methadone, Ur Screen: NEGATIVE (ref ?–300)
Opiate, Ur Screen: NEGATIVE (ref ?–300)
PHENCYCLIDINE (PCP) UR S: NEGATIVE (ref ?–25)
TRICYCLIC, UR SCREEN: NEGATIVE (ref ?–1000)

## 2013-10-19 LAB — COMPREHENSIVE METABOLIC PANEL
ALT: 16 U/L (ref 12–78)
ANION GAP: 7 (ref 7–16)
Albumin: 3.5 g/dL (ref 3.4–5.0)
Alkaline Phosphatase: 72 U/L
BUN: 7 mg/dL (ref 7–18)
Bilirubin,Total: 0.2 mg/dL (ref 0.2–1.0)
CALCIUM: 9.2 mg/dL (ref 8.5–10.1)
Chloride: 103 mmol/L (ref 98–107)
Co2: 25 mmol/L (ref 21–32)
Creatinine: 1.02 mg/dL (ref 0.60–1.30)
EGFR (African American): >60
Glucose: 166 mg/dL — ABNORMAL HIGH (ref 65–99)
OSMOLALITY: 272 (ref 275–301)
Potassium: 4.2 mmol/L (ref 3.5–5.1)
SGOT(AST): 15 U/L (ref 15–37)
Sodium: 135 mmol/L — ABNORMAL LOW (ref 136–145)
TOTAL PROTEIN: 7 g/dL (ref 6.4–8.2)

## 2013-10-19 LAB — ACETAMINOPHEN LEVEL: Acetaminophen: <2 ug/mL

## 2013-10-19 LAB — SALICYLATE LEVEL: Salicylates, Serum: 4.5 mg/dL — ABNORMAL HIGH

## 2013-10-19 LAB — ETHANOL: Ethanol %: <0.003 % (ref 0.000–0.080)
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
## 2014-11-30 NOTE — Consult Note (Signed)
Brief Consult Note: Diagnosis: schizophrenia.   Patient was seen by consultant.   Consult note dictated.   Orders entered.   Discussed with Attending MD.   Comments: Psychiatry: PAtient seen and chart reviewed. Patient with chronic schizophrenia just out of [facility] who had a spell of agitation on going to a new group home. Now has calmed down a lot and is willing to take medicine and would like to go home. Will give her doses of her medicine immediately and we are hoping to be able to let her go back to the group home now that she has calmed down.  Electronic Signatures: Audery Amellapacs, Marium Ragan T (MD)  (Signed 01-Aug-14 14:59)  Authored: Brief Consult Note   Last Updated: 01-Aug-14 14:59 by Audery Amellapacs, Halim Surrette T (MD)
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
## 2014-11-30 NOTE — Consult Note (Signed)
Psychiatry: Patient seen. Patient currently has no specific complaints. She is calm and lucid. Tearful at times but appropriately so. Not threatening or agitated. Patient is not being allowed to return to last group home. ER discharge coordinator working on finding appropriate disposition. Support done. Will continue current meds. Will follow.  Electronic Signatures: Tevyn Codd, Jackquline DenmarkJohn T (MD)  (Signed on 16-Oct-14 16:56)  Authored  Last Updated: 16-Oct-14 16:56 by Audery Amellapacs, Roey Coopman T (MD)

## 2014-11-30 NOTE — Consult Note (Signed)
Brief Consult Note: Diagnosis: Schizoaffective disorder bipolar type.   Patient was seen by consultant.   Consult note dictated.   Recommend further assessment or treatment.   Orders entered.   Comments: [name] has a h/o depression, psychosis and mood instability. She had an argument with group home staff when they refused to make her coffee this morning and refused her am medications. She is cool and collected. She is not suicidal, homicidal or psychotic.  PLAN: 1. The patient no longer meets criteria for IVC. I will terminate proceedings. Please discharge as appropriate.   2. She is to continue all her medications. No Rx necessary.   3. [facility] will [facility].  4. Group home staff will pick her up.  Electronic Signatures: Kristine LineaPucilowska, Raistlin Gum (MD)  (Signed 15-Sep-14 18:34)  Authored: Brief Consult Note   Last Updated: 15-Sep-14 18:34 by Kristine LineaPucilowska, Tytan Sandate (MD)
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
## 2014-11-30 NOTE — Consult Note (Signed)
Brief Consult Note: Diagnosis: schizophrenia.   Patient was seen by consultant.   Consult note dictated.   Orders entered.   Comments: Psychiatry: Patient seen. Chart reviewed. Patient well known to me from recent admission. Patient just discharged to a group home but had behavior problems there that lead to her being immediately sent back. Currently calm and at her baseline behavior. Patient not in need of hospital level treatment. Discharge coordinator working on finding some kind of plan but so far grop home refuses to take her back. Continue prn medication while negotiations continue.  Electronic Signatures: Audery Amellapacs, John T (MD)  (Signed 15-Oct-14 16:03)  Authored: Brief Consult Note   Last Updated: 15-Oct-14 16:03 by Audery Amellapacs, John T (MD)

## 2014-11-30 NOTE — Consult Note (Signed)
Brief Consult Note: Diagnosis: DM.   Patient was seen by consultant.   Consult note dictated.   Orders entered.   Comments: Added further meds, will contiunue following.  Electronic Signatures: Altamese DillingVachhani, Bethania Schlotzhauer (MD)  (Signed 23-Dec-14 13:42)  Authored: Brief Consult Note   Last Updated: 23-Dec-14 13:42 by Altamese DillingVachhani, Asucena Galer (MD)

## 2014-11-30 NOTE — Consult Note (Signed)
PATIENT NAME:  Maureen Cabrera, Maureen Cabrera MR#:  [id number] DATE OF BIRTH:  04/13/1956  DATE OF CONSULTATION:  04/24/2013  REFERRING PHYSICIAN:  Charlestine NightPhillip A. Scotty CourtStafford, MD CONSULTING PHYSICIAN:  Aedon Deason B. Lance Huaracha, MD  REASON FOR CONSULTATION: To evaluate a suicidal patient.   IDENTIFYING DATA: Maureen Cabrera is a [age] year old female with history of schizoaffective disorder.   CHIEF COMPLAINT: "They were mean to me."  HISTORY OF PRESENT ILLNESS: Maureen Cabrera was placed in a new group home about a month ago following discharge from [facility]. She likes it all right, but on the day of admission, she became upset with the staff. They reportedly refused to make her coffee in the morning. She became upset and was brought to the Emergency Room. The patient reports good compliance with medications. She denies thoughts of hurting herself or others. There are no psychotic symptoms. She is cool and collected now. She would like to return to her group home. She denies symptoms suggestive of bipolar mania. She denies substance abuse involvement.   PAST PSYCHIATRIC HISTORY: The patient has been hospitalized multiple times in different hospitals. She has been here before only for consultation. Her [facility] hospitalization [facility] at [facility]. She had been living on the [place] side of [place] in the [place] area. She has been moving to several group homes. She does not remember medications, but remembers being tried on Risperdal Consta. She did not like it. She feels that the current regimen of medications is  working well for her. She reports good compliance with medication. She denies ever attempting suicide.   FAMILY PSYCHIATRIC HISTORY: None reported.   PAST MEDICAL HISTORY: Diabetes, hypothyroidism.   ALLERGIES: HALDOL AND TRAZODONE.   MEDICATIONS ON ADMISSION: Aspirin 81 mg daily, metformin 850 mg daily, Synthroid 150 mcg daily, glipizide 5 mg daily, Ativan 2 mg twice daily, Depakote 500 mg twice daily,  Cogentin 1 mg 3 times daily, fluphenazine 10 mg 3 times daily, Tylenol as needed for pain, Prozac 10 mg daily.   SOCIAL HISTORY: She is disabled from mental illness. She is a resident of a group home. She is originally from [place].   REVIEW OF SYSTEMS:    CONSTITUTIONAL: No fevers or chills. No weight changes.  EYES: No double or blurred vision.  ENT: No hearing loss.  RESPIRATORY: No shortness of breath or cough.  CARDIOVASCULAR: No chest pain or orthopnea.  GASTROINTESTINAL: No abdominal pain, nausea, vomiting or diarrhea.  GENITOURINARY: No incontinence or frequency.  ENDOCRINE: No heat or cold intolerance.  LYMPHATIC: No anemia or easy bruising.  INTEGUMENTARY: No acne or rash.  MUSCULOSKELETAL: No muscle or joint pain.  NEUROLOGIC: No tingling or weakness.  PSYCHIATRIC: See history of present illness for details.   PHYSICAL EXAMINATION: VITAL SIGNS: Blood pressure 152/100, pulse 89, respirations 18, temperature 97.8.  GENERAL: This is a well-developed female in no acute distress.   The rest of the physical examination is deferred to her primary attending.   LABORATORY DATA: Chemistries within normal limits except for blood glucose of 178, potassium of 3.1. Blood alcohol level zero. LFTs within normal limits. TSH is 0.3. Depakote level 46. Urine tox screen negative for substances. CBC within normal limits. Urinalysis is positive for leukocyte esterase 3+ and 15 white cells per field.   MENTAL STATUS EXAMINATION: The patient is alert and oriented to person, place, time and situation. She is very pleasant, polite and cooperative. She is examined in the Emergency Room. She is well groomed, wearing hospital scrubs. She maintains  good eye contact. Her speech is of normal rhythm, rate and volume. Mood is fine with full affect. Thought process is logical and goal oriented. Thought content: She denies suicidal or homicidal ideation. There are no delusions or paranoia. There are no auditory  or visual hallucinations. Her cognition is grossly intact. She registers 3 out of 3, recalls 3 out of 3 objects after 5 minutes. She can spell "world" forward and backward. She knows the current president.   DIAGNOSES: AXIS I: Schizoaffective disorder, bipolar type. AXIS II: Deferred.  AXIS III: Hypothyroidism, diabetes.  AXIS IV: Mental illness, a conflict at the group home, primary support.  AXIS V: Global Assessment of Functioning: 55.   PLAN:  1.  The patient no longer meets criteria for involuntary inpatient psychiatric commitment. I will terminate proceedings. Please discharge as appropriate.  2.  She is to continue all her medications as prescribed at the time of [facility] from [facility]. No prescriptions necessary.  3.  She will follow up with a psychiatrist at [facility].  4.  Group home staff will pick her up.   ____________________________ Braulio Conte B. Jennet Maduro, MD jbp:jm D: 04/25/2013 16:11:03 ET T: 04/25/2013 17:24:57 ET JOB#: [id number]  cc: Jaylan Duggar B. Jennet Maduro, MD, <Dictator> Shari Prows MD ELECTRONICALLY SIGNED 05/01/2013 6:55
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
## 2014-12-01 NOTE — Consult Note (Signed)
Psychiatry: Follow-up visit with this patient with schizophrenia.  Today she was awake and alert and again was interactive with me.  She told me that she was having some pain.  She told me that she was tired of being in the hospital and very much wanted to leave.  She asked me to help her get up out of bed and have a seat.  Affect was somewhat constricted.  Mood stated as being worried.  Thoughts generally organized though simple and concrete.  He denied having any hallucinations.  Didn't make any hostile statements.  No suicidal or homicidal ideation.  Responded well to supportive counseling to be cooperative with treatment so that she could get well quicker.  Follow-up during her hospitalization.  I had ordered a another Invega injection but that turned out to be a duplicate.  Order was canceled.  No other change to medication.  Electronic Signatures: Audery Amellapacs, Jemma Rasp T (MD)  (Signed on 29-Jan-15 20:17)  Authored  Last Updated: 29-Jan-15 20:17 by Audery Amellapacs, Tykee Heideman T (MD)

## 2014-12-01 NOTE — Discharge Summary (Signed)
PATIENT NAME:  Maureen DenmarkDEANS, Dreyah MR#:  [id number] DATE OF BIRTH:  1956-08-05  DATE OF ADMISSION:  08/27/2013 DATE OF DISCHARGE:  09/14/2013  ADMITTING DIAGNOSIS: Encephalopathy and pneumonia.   DISCHARGE DIAGNOSES:  1. Encephalopathy due to aspiration pneumonia, now resolved.  2. Acute respiratory failure, requiring intubation, status post extubation 09/06/2013.  3. Aspiration pneumonia.  4. Septic shock due to aspiration pneumonia.  5. Metabolic encephalopathy due to pneumonia and hypoxia.  6. Bipolar disorder.  7. Hypothyroidism.  8. Diabetes.  9. Hypomagnesemia.  10. Hypernatremia.  11. Generalized weakness.  12. Nicotine addiction.  13. Chronic obstructive pulmonary disease.  14. Chronic constipation.  15. Osteoporosis.  16. History of previous psychosis.  17. History of previous suicide attempt.  18. Schizoaffective disorder and bipolar disorder.  19. Previous history of urinary tract infections.   20. Gastroesophageal reflux disease.  21. Degenerative joint disease.   CONSULTANTS DURING HOSPITALIZATION: Dory LarsenKurian D. Kasa, MD  EVALUATIONS: For details of the patient's evaluations, please refer to interim summary done by Dr. Mordecai MaesSanchez on February 4th as well as Dr. Juliene PinaMody on January 28th.   HOSPITAL COURSE: Please refer to H and P and the interim summaries. The patient is a 59 year old white female with history of psychiatric history with bipolar schizoaffective disorder, hypothyroidism, who presented with acute encephalopathy and respiratory failure. The patient was noted to have pneumonia as well as sepsis. She was treated with vancomycin to cover aspiration, and she finished a course of antibiotics. The patient also had significant encephalopathy. She also required intubation for respiratory failure. Her respiratory failure required intubation. She subsequently was slowly weaned off the ventilator and was on oxygen. Now, she is off oxygen. Her respiratory status is now resolved. She  was treated for a COPD exacerbation with steroids, which have been stopped. As far as her encephalopathy, her mental status is back to baseline. She is doing much better and is stable to return to the group home where she resided.   DISCHARGE MEDICATIONS:  1. Lisinopril 5 mg 1 tab p.o. daily.  2. Divalproex sodium 500 mg 1 tab p.o. b.i.d. 3. Metformin 1000 mg 1 tab p.o. b.i.d. 4. Pravastatin 40 at bedtime. 5. Colace 100 mg 2 caps 2 times a day. 6. Docusate and senna 1 tab p.o. b.i.d. 7. Protonix 40 mg 1 tab p.o. b.i.d. 8. Invega 156 mg IM once a month for psychosis.  9. Nicotine patch to complete as previously written.  10. Januvia 25 daily.  11. Oxybutynin 5 mg 1 tab p.o. b.i.d. 12. Diclofenac 75 mg 1 tab p.o. daily.  13. Glimepiride 4 mg daily.  14. Aspirin 81 mg 1 tab p.o. daily.  15. Gabapentin 300 mg 1 tab p.o. t.i.d. 16. Lorazepam 0.5 p.o. q.8 for anxiety.  17. Clozapine 100 mg 2 tabs daily. 18. ProAir 2 puffs q.4 p.r.n. 19. MiraLax 17 grams daily.  20. Tramadol 50 mg 1 tab p.o. b.i.d. 21. Levothyroxine 75 mcg daily. 22. Zofran 4 mg q.8 p.r.n. for nausea.   REFERRAL: Home health physical therapy and occupational therapy.   DIET: Low sodium, carbohydrate-controlled diet. Diet consistency: Regular consistency, mechanical soft.   ACTIVITY: As tolerated with walker.   FOLLOWUP: With primary MD in 1 to 2 weeks.  TIME SPENT: 35 minutes spent on the discharge.    ____________________________ Lacie ScottsShreyang H. Allena KatzPatel, MD shp:lb D: 09/15/2013 08:32:10 ET T: 09/15/2013 09:59:56 ET JOB#: [id number]  cc: Torin Whisner H. Allena KatzPatel, MD, <Dictator> Charise CarwinSHREYANG H Simrah Chatham MD ELECTRONICALLY SIGNED 09/23/2013 6:35

## 2014-12-01 NOTE — Consult Note (Signed)
PATIENT NAME:  Maureen Cabrera, Maureen Cabrera MR#:  [id number] DATE OF BIRTH:  [birth year]  DATE OF CONSULTATION:  08/01/2013  REFERRING PHYSICIAN:  Dr. Kristine LineaJolanta Pucilowska from psychiatry CONSULTING PHYSICIAN:  Hope PigeonVaibhavkumar G. Elisabeth PigeonVachhani, MD  REASON FOR CONSULTATION: Diabetes management.   HISTORY OF PRESENT ILLNESS: This is a 59 year old female who is a resident of group home with multiple psychiatric issues in the past admitted to behavior unit as inpatient with involuntary commitment because of suicidal thoughts and delusion of pregnancy. She is under treatment on psych floor. Medical consult has been called for uncontrolled blood sugar level. On further questioning to the patient, she denies all complaints, but says that for the last 4 to 5 days did not have any bowel movement and feels a little tight in her abdomen and also asking me "do you think I might be pregnant." She denies any nausea, vomiting, or fever and has been receiving her diabetes medication by nurses regularly in the last few days.   REVIEW OF SYSTEMS: CONSTITUTIONAL: Negative for fever, fatigue, weakness, pain, or weight loss.  EYES: No blurring, double vision, discharge, or redness.  EARS, NOSE, THROAT: No tinnitus, ear pain, or hearing loss.  RESPIRATORY: No cough, wheezing, hemoptysis, or shortness of breath.  CARDIOVASCULAR: No chest pain, orthopnea, edema, or arrhythmia.  GASTROINTESTINAL: No nausea, vomiting, diarrhea. Mild abdominal discomfort and some constipation.  GENITOURINARY: No dysuria, hematuria, or increased frequency.  ENDOCRINE: No poly hydria, no nocturia, no increased sweating, heat or cold intolerance.  SKIN: No acne, rashes, or lesions.  MUSCULOSKELETAL: No pain or swelling in the joints.  NEUROLOGIC: No numbness, weakness, tremor, or vertigo.  PSYCHIATRIC: No anxiety, insomnia, or bipolar disorder.  LABORATORY DATA: On admission creatinine was 0.82 and potassium was 4. Blood sugar in the last few days is remaining  in range of 200 to 250. TSH level was checked as she is on thyroid supplement, 0.12. White cell count 14.2, hemoglobin 15, and platelet count 180 on admission.   PAST MEDICAL HISTORY: Diabetes, hypertension, hyperlipidemia, and multiple psychiatric issues.   FAMILY HISTORY: Positive for diabetes in her mother.   SOCIAL HISTORY: She lives in a group home. She is a smoker, smokes almost 1 pack or sometimes 3 or 4 cigarettes less in a pack in 1 day. Denies drinking alcohol or illegal drug use.   CURRENT MEDICATIONS: On the psychiatry floor: 1.  Benztropine 1 mg oral 2 times a day. 2.  Clonazepam 100 mg oral at bedtime.  3.  Diclofenac 75 mg oral daily.  4.  Depakote 500 mg oral b.i.d.  5.  Levothyroxine 0.05 mg every morning.  6.  Lisinopril 5 mg daily.  7.  Pantoprazole 40 mg oral twice daily.  8.  Pravastatin 40 mg at bedtime.  9.  Aspirin 81 mg daily.  10.  Glimepiride 4 mg oral daily (she is taking it since almost a year)  11.  Insulin sliding scale coverage according to the blood sugar level 4 times a day.  12.  Metformin tablet 100 mg oral b.i.d. (she is taking it since almost a year).  VITAL SIGNS: Temperature 97.7, pulse 96, respirations 16, blood pressure 154/90 on sitting and on lying down 116/87.   ASSESSMENT AND PLAN: A 59 year old female with past medical history of diabetes, hypertension, hyperlipidemia, and psychiatric issue admitted to behavior unit for suicidal ideation with uncontrolled diabetes. 1.  Uncontrolled diabetes mellitus. The patient is taking metformin 1000 mg b.i.d. and glimepiride 4 mg daily since almost last 1 year  and as per her blood sugar was ranging 120 to 130 at group home before she came to hospital. Now in hospital she is noticed having blood sugar level up to 200 to 250. That has been covered by insulin sliding scale coverage 4 times a day. I will add Januvia to her medication regimen and will continue following her insulin sliding scale coverage. If  needed she might need to be started on long-acting insulin injections later on. She is counseled about not having too much sugary drinks like Pepsi what she was drinking a lot before.  2.  Hypertension, taking lisinopril. Blood pressure under control.  3.  Hyperlipidemia, taking pravastatin. Continue the same.  4.  Psychosis and suicidal ideation. Continue management as per Behavioral Medicine instructions.  5.  Smoker. Smoking cessation counseling is done for 4 minutes. She is currently using nicotine inhalers. I advised her to continue using the same after discharge and slowly quit smoking.   TOTAL TIME SPENT ON THIS CONSULT. 40 minutes.  ____________________________ Hope Pigeon Elisabeth Pigeon, MD vgv:sb D: 08/01/2013 13:53:44 ET T: 08/01/2013 14:30:02 ET JOB#: [id number]  cc: Hope Pigeon. Elisabeth Pigeon, MD, <Dictator> Altamese Dilling MD ELECTRONICALLY SIGNED 08/13/2013 22:06

## 2014-12-01 NOTE — H&P (Signed)
PATIENT NAME:  Maureen Cabrera, Maureen Cabrera MR#:  [id number] DATE OF BIRTH:  1955-10-01  DATE OF ADMISSION:  08/27/2013  PRIMARY CARE PHYSICIAN:  Dr. Lacie Scotts   HISTORY OF PRESENT ILLNESS: The patient is a 59 year old Caucasian female with past medical history significant for history of diabetes mellitus, history of osteoporosis, hypertension, hyperlipidemia, tobacco abuse, also history of schizoaffective disorder bipolar type, borderline  intellect, who presented to the hospital with complaints of unresponsiveness.  According to Emergency Room physician as well as group home staff, the patient still wants to be woken up today in the morning for breakfast. When staff came into her room, she smelled of urine. When she approached the patient was lying down on the side, facing the wall, she was noted to have foam around her mouth and she was somewhat short of breath, struggling to breath.  EMS was called and the patient was brought to the Emergency Room for further evaluation. She was somewhat responsive whenever she was called or moved around. She would wake up for just a few seconds and she would swing or course, which is nothing unusual for her, apparently that is how she usually responds to waking up. She and was noted to be hypoxic O2 sats were in the 80s on nonrebreather and was intubated in the Emergency Room. During intubation, she was noted to have some fluid in her lungs, frothy fluid was suctioned from her lung area. She was placed on mechanical ventilation and after that she was noted to be hypotensive even with multiple boluses of IV fluids, she remains hypotensive. Hospitalist services were contacted for admission. She was also noted to be febrile with temperature of slightly above 100. Chest x-ray revealed pneumonia, which was concerning for possible aspiration. Her labs revealed a renal failure which was new, concerning for possible dehydration. The patient's group home staff told me that she was recently  started on ciprofloxacin 500 mg twice daily dose for urinary tract infection. Antibiotic was initiated on 08/23/2013. The patient's EKG in the Emergency Room revealed accelerated junctional rhythm at rate of 126 beats per minute as well as poor RV progression and low voltage QRS. The patient is being admitted to the hospital with acute renal failure as well as encephalopathy of unclear etiology.   PAST MEDICAL HISTORY: Significant for history of diabetes mellitus with hemoglobin A1c 7.6 in December 2014, history of constipation, osteoporosis, hypertension, hyperlipidemia, ongoing tobacco abuse, history of psychosis, as well as suicidal ideation in the past, history of schizoaffective disorder bipolar type, admission to the hospital December 2014 for the same, history of borderline intellectual functioning, hypothyroidism, recent diagnosis of urinary tract infection, started on ciprofloxacin on 08/23/2013. History of gastroesophageal reflux disease as well as degenerative joint disease.   MEDICATIONS: According to medical records, the patient is on aspirin 81 mg p.o. daily, Ativan 0.5 mg 3 times daily, ciprofloxacin 500 mg twice daily, clozapine 200 mg p.o. daily at bedtime, diclofenac sodium 75 mg p.o. once daily for arthritis, divalproex sodium 500 mg p.o. twice daily, docusate sodium 100 mg 2 capsules twice daily, docusate  Senna 50/8.6 mg 1 tablet twice daily, glimepiride 4 mg p.o. daily, Invega Sustenna 156 mg intramuscular suspension once a month. The next dose will be on the 09/05/2013 according to group home staff; however, this injection should be performed on the 09/04/2013, Januvia 25 mg p.o. daily, levothyroxine 50 mcg p.o. daily, lisinopril 5 mg p.o. daily, metformin 1 gram twice daily, Neurontin 300 mg 3 times daily, nicotine transdermal film  7 mg topically daily, oxybutynin 5 mg p.o. twice daily, pantoprazole 40 mg p.o. twice daily, polyethylene glycol 17 grams once daily as needed, pravastatin 40  mg p.o. daily, ProAir HFA 2 puffs 4 times daily as needed, Ultram 50 mg p.o. twice daily.   FAMILY HISTORY: Positive for diabetes in the patient's mother.   SOCIAL HISTORY: The patient lives in a group home. She has been living in this group home for only 2-1/2 weeks. She is a smoker, smokes approximately 1 pack a day; however, it is unclear if she is actually quitting smoking now that since she is on replacement therapy. No history of alcohol or illegal drug abuse.   REVIEW OF SYSTEMS: Not available as the patient is intubated.  PHYSICAL EXAMINATOIN:   VITAL SIGNS: On arrival to the Emergency Room, the patient's vital signs were temperature is 103.3, pulse 142, respiration rate was 15, blood pressure 75/50, saturation was 80% on nonrebreather.  GENERAL:  This is a well-developed, well-nourished, obese Caucasian female in moderate to severe distress due to dyspnea. She is lying on the side.  HEENT: Her pupils are equal, reactive to light. Not able to assess her extraocular movements. No conjunctivitis. Not able to assess his hearing, as she is sedated. No sinus edema, mucosa is moist.  She has ET tube in her mouth and she has is saliva streaking down from her mouth.  NECK: No masses. Supple. Nontender. Thyroid is not enlarged. No adenopathy, no JVD, carotid bruits bilaterally.  Full range of motion.  LUNGS: Crackles as well as rales to auscultation bilaterally in all lung fields, anteriorly somewhat diminished breath sounds; however, no wheezing. The patient does have labored respirations as well as increased effort to breathe. No dullness to percussion, in moderate respiratory distress.  CARDIOVASCULAR: S1, S2 appreciated. Rythm was regular. PMI not lateralized. Chest is nontender to palpation. 1+ pedal pulses.   EXTREMITIES: No lower extremity edema, calf tenderness or cyanosis was noted.  ABDOMEN: Soft, nontender. Bowel sounds are present. No hepatosplenomegaly or masses were noted.  RECTAL:  Deferred. The patient does have G-tube draining light greenish or yellowish fluid.  MUSCLE STRENGTH: Not able to assess. No cyanosis, degenerative joint disease or kyphosis. Gait is not tested.  SKIN: No skin rashes, lesions, erythema, nodularity or induration. It was warm and dry to palpation. No adenopathy in cervical region.  NEUROLOGIC: Cranial nerves not able to assess not able to assess sensory or aphasia. The patient is intubated and sedated.   LABORATORY, DIAGNOSTIC AND RADIOLOGIC DATA: BMP: Glucose was 187. BUN and creatinine were 23 and 1.56. Otherwise, BMP was unremarkable.   Liver enzymes revealed albumin level of 3.1, otherwise normal. The patient's CK MB as well as troponin levels were normal. The patient's urine drug screen was negative. CBC within normal limits with white blood cell count 8.0, hemoglobin 13.4, platelet count 209.   Urinalysis: Yellow, clear, negative for glucose, bilirubin, trace ketones were noted. Specific gravity 1.016. pH was 5.0, negative for blood, protein, nitrites or leukocyte esterase, 6 red blood cells, 4 white blood cell, no bacteria was seen, less than 1e epithelial cell as well as mucus was present 6 hyaline casts. The patient had acetaminophen level checked which was less than 2.0, salicylate level was mildly elevated at 4.2.   Chest x-ray, portable single view, 08/27/2013, revealed patchy densities throughout the lung concerning for infection and cannot exclude pleural fluid according to radiologist. ET tube is positioned just above the carina, according to radiologist.  The patient's EKG revealed accelerated junctional rhythm at 126 beats per minute, low voltage QRS, poor R wave progression at V3.  No acute ST-T changes were noted.   ASSESSMENT AND PLAN: 1.  Acute respiratory failure, likely due to pneumonia. Admit the patient to the medical floor. Continue mechanical ventilation and get  pulmonary consultation.  2.  Pneumonia, questionable  aspiration. Start Levaquin as well as Zosyn. We will follow culture results.  3.  Encephalopathy, unclear etiology, possibly related to high fevers. We will also get clozapine as well as valproic acid levels. We will need likely psych consult when off of ventilation if needed.   We will need to inject [place]vega Sustenna  on the 09/05/2013 at 156 mg IM once, which is given monthly.  4.  Diabetes mellitus.  As the patient is nothing oral, we will continue IV fluids for now. We will continue sliding scale insulin.  5.  Hypothyroidism. Check TSH as well as continue Synthroid.  6.  Gastrointestinal bleed as well as deep vein thrombosis prophylaxis.  We will initiate proton pump inhibitor IV as well as heparin subcutaneously.   TIME SPENT: 1 hour 15 minutes    ____________________________ Katharina Caperima Ashten Prats, MD rv:cc D: 08/27/2013 10:29:19 ET T: 08/27/2013 22:20:29 ET JOB#: [id number]  cc: Katharina Caperima Chitara Clonch, MD, <Dictator> Meindert A. Lacie ScottsNiemeyer, MD  Katharina CaperIMA Jonesha Tsuchiya MD ELECTRONICALLY SIGNED 09/01/2013 14:35

## 2014-12-01 NOTE — Op Note (Signed)
PATIENT NAME:  Maureen Cabrera, Maureen Cabrera MR#:  [id number] DATE OF BIRTH:  Apr 22, 1956  DATE OF PROCEDURE:  08/27/2013  PREOPERATIVE DIAGNOSES: 1. Acute respiratory failure.  2. Hypertension, requiring pressors.   POSTOPERATIVE DIAGNOSES:  1. Acute respiratory failure.  2. Hypertension, requiring pressors.   PROCEDURES: 1. Ultrasound guidance for vascular access, right jugular vein.  2. Placement of right jugular triple-lumen catheter.   SURGEON: Festus BarrenJason Melek Pownall, M.D. and [facility] PA-C.   ANESTHESIA: Local.   ESTIMATED BLOOD LOSS: Minimal.   INDICATION FOR PROCEDURE: A 59 year old white female who was admitted today to the Critical Care Unit in respiratory failure hypotensive, requiring pressors in need of venous access. We are asked to place a central line.   DESCRIPTION OF PROCEDURE: The patient is laid flat in her critical care bed. The right neck and chest were sterilely prepped and draped and a sterile surgical field was created. The right jugular vein was visualized with ultrasound and found to widely patent. It was then accessed under direct ultrasound guidance without difficulty with Seldinger needle. A J-wire was then placed. After skin nick and dilatation, the triple lumen catheter was placed over the wire and the wire was removed. All three lumens withdrew dark red blood and flushed easily with sterile saline. It was secured at 19 cm with 3 silk sutures. A stat chest x-ray is pending.     ____________________________ Annice NeedyJason S. Donie Lemelin, MD jsd:sg D: 08/27/2013 13:55:48 ET T: 08/28/2013 07:57:27 ET JOB#: [id number]  cc: Annice NeedyJason S. Bladimir Auman, MD, <Dictator> Annice NeedyJASON S Kinsey Cowsert MD ELECTRONICALLY SIGNED 08/28/2013 11:47

## 2014-12-01 NOTE — Consult Note (Signed)
Brief Consult Note: Diagnosis: schizoaffective disorder.   Patient was seen by consultant.   Consult note dictated.   Recommend further assessment or treatment.   Comments: Psychiatry: Patient seen. Chart reviewed. Patient known to me from multiple prior pstchiatry visits. Maureen Cabrera has schizoaffective disorder and even with the best treatment has a tendency to be impulsive, impatient and emotionally labile. She is currently on the right meds. PRN meds to help to keep her behavior from endangering herself may be needed. Klonipin works pretty well. She can be demanding under the best circumstances.  She does not have a guardian but she certainly could use one. Her judgement is chronically bad. Problem as been finding someone willing to do it. I'll follow up.  Electronic Signatures: Maureen Cabrera, [name] (MD)  (Signed 28-Jan-15 12:30)  Authored: Brief Consult Note   Last Updated: 28-Jan-15 12:30 by Maureen Cabrera, Maureen Cabrera (MD)

## 2014-12-01 NOTE — Consult Note (Signed)
PATIENT NAME:  Maureen Cabrera, Maureen Cabrera MR#:  [id number] DATE OF BIRTH:  Sep 28, 1955  DATE OF CONSULTATION:  09/06/2013  REFERRING PHYSICIAN:   CONSULTING PHYSICIAN:  Audery Amel, MD  IDENTIFYING INFORMATION AND REASON FOR CONSULT: A 59 year old woman with schizoaffective disorder who is currently in the critical care unit recovering from a pneumonia. Consultation because of her history of serious psychiatric illness.   HISTORY OF PRESENT ILLNESS: Information obtained from the chart and somewhat from the patient. This patient, who has long-standing mental health issues, was admitted to the hospital on January 18th. She has been in the critical care unit for most of the time since then. She was intubated for many days. The patient appears to have a pneumonia, possibly aspiration related. When possible, she has been maintained on her usual outpatient psychiatric medicines. She was kept sedated for safety while intubated. Now that she has been extubated, there is concern about making sure she is psychiatrically stable. I interviewed the patient today. She had just recently been extubated and could speak only haltingly and very little. She knew where she was. She knew the year. She recognized me from our previous interactions. She did not have any complaints about her mood. She denied having auditory hallucinations currently. Denied any suicidal or homicidal ideation. She was already making requests to get up and get out of bed and was starting to become demanding, although not in an angry manner. This is pretty typical of the patient.   PAST PSYCHIATRIC HISTORY: Ms. Kimpel relocated to our area just within about the last year or so and even during that time, she has had several hospitalizations psychiatrically. She has a long history of mental health problems, best diagnosed I would say as schizoaffective disorder. She has recently been kept fairly stable on a regimen of Invega shots, Depakote and clozapine. She  does have a history of aggressive behavior in the past. I believe she does have a more distant history of suicidal behavior. Most notably when we have interacted with her, she tends to get labile and intrusive and agitated very easily even with the best treatment. She has had behavior problems that have caused her to have to move around from one group home to another and prevented her from having much stability. On the psychiatric ward even with aggressive medication management and therapy, she is still often somewhat intrusive and difficult to manage and requires a lot of redirection to control her impulsivity. She will typically develop a delusion that she is pregnant when she is psychotic.   MEDICAL HISTORY: The patient has diabetes, gastric reflux symptoms, hypothyroid, high blood pressure, chronic pain.   SOCIAL HISTORY: She does have a brother who is probably her closest relative, but he is not her guardian and has very little interest in being involved. Her extended family has pretty much cut all of their ties with her. She does not, however, have a guardian as far as I know. It seems like there has probably been some difficulty finding anyone who would take responsibility for being her guardian. She is currently residing in a group home.   SUBSTANCE ABUSE HISTORY: I am not sure about more distantly but recently she does not have an active substance abuse problem.   REVIEW OF SYSTEMS: She complains of diffuse aching pain. Says that she is feeling tired. Denies suicidal ideation. Denies hallucinations.   MENTAL STATUS EXAM: Acutely sick-appearing woman in the critical care unit. She was awake and alert, made good eye contact.  She recognized me immediately when I walked in and interacted appropriately. Speech was slow, decreased in tone, hard to understand because of her sore throat. Affect was flat. Mood was stated as okay. Thoughts slow. Did not make obviously delusional statements. Denies  hallucinations. Denies suicidal or homicidal ideation. Judgment and insight are chronically impaired. An example of that is already today saying that she was ready to get up and go home despite the obviousness of how sick she currently is.   CURRENT MEDICATIONS: She is currently being prescribed her clozapine 200 mg at bedtime, Depakote 500 mg twice a day, gabapentin 300 mg 3 times a day, Synthroid 75 mcg a day, Ativan 0.5 mg 3 times a day. She is getting tramadol 50 mg b.i.d. Solu-Medrol injection currently for her breathing. She is on Lovenox. Has insulin at night and sliding scale.   ASSESSMENT: This is a 59 year old woman with schizoaffective disorder. Her mental health is poor and even with the best treatment she has proven difficult to manage as an outpatient. Now that she is extubated, I am concerned that her impulsivity and mood swings and the whole spectrum of her mental health problems are going to make it difficult to work with her in the critical care setting.   TREATMENT PLAN: I talked with the patient about my concerns and tried to gently emphasize to her the crucial importance of her being compliant with treatment in the critical care unit and of being patient with being in the hospital. I think she will need a lot of redirection and will probably require a lot of patience from the nursing staff. As far as her medication, I do not think that there is anything more that needs to be done except that she is due for her [place]vega Sustenna shot. I am going to put in an order for her to get that injection. I will try to follow up with her while she is in the hospital. Hopefully, she will make a good recovery physically. Once she does, we can hope that she will be able to go back to her group home as she has proven difficult to place in the past.   DIAGNOSIS, PRINCIPAL AND PRIMARY:  AXIS I: Schizoaffective disorder, bipolar type.   SECONDARY DIAGNOSES:  AXIS I: No further.  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  AXIS III: Current pneumonia, diabetes, high blood pressure.  AXIS IV: Severe from lack of social support.  AXIS V: Functioning at time of evaluation 30.    ____________________________ Audery AmelJohn T. Ario Mcdiarmid, MD jtc:gb D: 09/06/2013 23:40:07 ET T: 09/06/2013 23:59:27 ET JOB#: [id number]  cc: Audery AmelJohn T. Sidni Fusco, MD, <Dictator> Audery AmelJOHN T Trease Bremner MD ELECTRONICALLY SIGNED 09/07/2013 12:48

## 2014-12-01 NOTE — Consult Note (Signed)
Psychiatry: Follow-up for this patient with schizoaffective disorder.  Today she was sitting up out of bed awake and alert.  Recognized me immediately and engaged in appropriate conversation.  Mood was described as being good.  Thoughts appear to be lucid.  Didn't make any bizarre statements.  Denies hallucinations.  Denies suicidal or homicidal ideation.  She understands her situation and that she will probably be going to rehabilitation.  She is looking forward to it and looking forward to recovering to the point of being able to go back to her group home.  Tolerating medicine well. appears to be doing quite well mentally.  Very resilient and seems to be taking care to recover from her illness.  Supportive therapy done.  I tried to make sure she got some denture adhesive which she was asking for.  If I don't get a chance to see her tomorrow I wished her well and hopes that things go well at the rehabilitation.  Electronic Signatures: Audery Amellapacs, Luiscarlos Kaczmarczyk T (MD)  (Signed on 02-Feb-15 23:30)  Authored  Last Updated: 02-Feb-15 23:30 by Audery Amellapacs, Kym Fenter T (MD)

## 2014-12-01 NOTE — Consult Note (Signed)
Psychiatry: Follow-up on this patient with chronic mental illness.  Patient was alert and awake again today.  Her speech is getting better.  She still feels very weak and needs assistance with eating.  She reports that her mood is okay.  She still focuses on wanting to go home but she is not showing any acute problem behaviors.  She seems to be making a real effort to be cooperative.  No indication to change any current psychiatric treatment.  Supportive and educational therapy continued.  We will follow as needed.  Electronic Signatures: Audery Amellapacs, Nare Gaspari T (MD)  (Signed on 31-Jan-15 23:33)  Authored  Last Updated: 31-Jan-15 23:33 by Audery Amellapacs, Maddalyn Lutze T (MD)

## 2014-12-01 NOTE — Consult Note (Signed)
CHIEF COMPLAINT and HISTORY:  Subjective/Chief Complaint Need for vascular access   History of Present Illness 59 year old white female needing central line for better access due to hypotension, shock, on pressors. Cannot get history from patient as that she is intubated. According to H&P she was found to be poorly responsive, hypoxic, foaming at mouth, difficulty breathing. CXR with pneumonia suspecting aspiration. Recent tx for UTI with cipro started 08/23/13.   She needs a central line which is the reason we were consulted.   PAST MEDICAL/SURGICAL HISTORY:  Past Medical History:   HTN:    depression:    bipolar:    paranoid schizophrenia:    diabetes:    c section:    cholecystectomy:   ALLERGIES:  Allergies:  Haldol: Unknown  Trazodone: Unknown  Ibuprofen: Unknown  HOME MEDICATIONS:  Home Medications: Medication Instructions Status  ciprofloxacin 500 mg oral tablet 1 tab(s) orally 2 times a day for 10 days. Active  nicotine 21 mg/24 hr transdermal film, extended release 1 patch transdermal once a day for 2 months, then start 40m patch.  *start date 08/18/13  Active  Invega Sustenna 156 mg/mL intramuscular suspension, extended release 156 milligram(s) intramuscular once a month for psychosis. next injection on September 05, 2013. Active  pravastatin 40 mg oral tablet 1 tab(s) orally once a day (at bedtime) for high cholesterol. Active  metFORMIN 1000 mg oral tablet 1 tab(s) orally 2 times a day for diabetes. Active  lisinopril 5 mg oral tablet 1 tab(s) orally once a day for high blood pressure. Active  divalproex sodium 500 mg oral delayed release tablet 1 tab(s) orally 2 times a day (with meals) for mood stabilization. Active  docusate sodium 100 mg oral capsule 2 cap(s) orally 2 times a day for constipation. Active  docusate-senna 50 mg-8.6 mg oral tablet 1 tab(s) orally 2 times a day for constipation. Active  pantoprazole 40 mg oral delayed release tablet 1 tab(s)  orally 2 times a day for acid reflux. Active  nicotine 14 mg/24 hr transdermal film, extended release After finishing 245mdose, Apply 1 patch transdermal once a day for 2 months, then start 68m45mose. Active  nicotine 7 mg/24 hr transdermal film, extended release after completing 42m58mse, apply 1 patch transdermal once a day x 2 months.   Active  Januvia 25 mg oral tablet 1 tab(s) orally once a day Active  oxybutynin 5 mg oral tablet 1 tab(s) orally 2 times a day Active  diclofenac sodium 75 mg oral delayed release tablet 1 tab(s) orally once a day. *take with food* *do not crush* Active  levothyroxine 50 mcg (0.05 mg) oral tablet 1 tab(s) orally once a day (in the morning) for thyroid. Active  glimepiride 4 mg oral tablet 1 tab(s) orally once a day Active  aspirin 81 mg oral tablet, chewable Chew and swallow 1 tab(s) orally once a day Active  gabapentin 300 mg oral capsule 1 cap(s) orally 3 times a day Active  LORazepam 0.5 mg oral tablet 1 tab(s) orally every 8 hours for anxiety. Active  cloZAPine 100 mg oral tablet 2 tabs (200mg26mally once a day Active  ProAir HFA CFC free 90 mcg/inh inhalation aerosol 2 puff(s) inhaled every 4 hours as needed for shortness of breath/wheezing  Active  polyethylene glycol 3350 - oral powder for reconstitution 17 gram(s) in 8oz of water and drink orally once a day as needed for constipation. Active  traMADol 50 mg oral tablet 1 tab(s) orally 2 times  a day Active   Family and Social History:  Family History Unable to obtain   Social History Unable to obtain   Review of Systems:  ROS Pt not able to provide ROS  Intubated   Physical Exam:  GEN critically ill appearing, Intubated   HEENT moist oral mucosa   NECK supple  No masses   RESP Intubated, decreased bases   CARD regular rate   ABD denies tenderness   LYMPH negative neck   SKIN normal to palpation   NEURO Intubated   LABS:  Laboratory Results: Thyroid:    18-Jan-15 08:31,  Thyroid Stimulating Hormone  Thyroid Stimulating Hormone 7.89  0.45-4.50  (International Unit)   -----------------------  Pregnant patients have   different reference   ranges for TSH:   - - - - - - - - - -   Pregnant, first trimetser:   0.36 - 2.50 uIU/mL  LabObservation:    18-Jan-15 14:34, Echo Doppler  OBSERVATION   Reason for Test  Hepatic:    18-Jan-15 08:31, Comprehensive Metabolic Panel  Bilirubin, Total 0.3  Alkaline Phosphatase 65  45-117  NOTE: New Reference Range  06/30/13  SGPT (ALT) 17  SGOT (AST) 27  Total Protein, Serum 6.8  Albumin, Serum 3.1  TDMs:    18-Jan-15 08:31, Valproic Acid, Serum  Valproic Acid, Serum 81  50-100  POTENTIALLY TOXIC:   > 200 mcg/mL  Routine Micro:    18-Jan-15 11:14, Influenza A + B Antigen ([facility])  Micro Text Report   INFLUENZA A+B ANTIGENS    COMMENT                   NEGATIVE FOR INFLUENZA A (ANTIGEN ABSENT)    COMMENT                   NEGATIVE FOR INFLUENZA B (ANTIGEN ABSENT)     ANTIBIOTIC  Comment 1..   NEGATIVE FOR INFLUENZA A (ANTIGEN ABSENT) A negative result does not exclude influenza.  Correlation with clinical impression is required.  Comment 2..   NEGATIVE FOR INFLUENZA B (ANTIGEN ABSENT)   Result(s) reported on 27 Aug 2013 at 11:44AM.  Lab:    18-Jan-15 10:50, ABG and Lactic Acid  pH (ABG) 7.35  PCO2 30  PO2 103  FiO2 100  Base Excess -7.9  HCO3 16.6  O2 Saturation 97.1  O2 Device trilogy  Specimen Site (ABG)   RT RADIAL  Specimen Type (ABG) ARTERIAL  Patient Temp (ABG) 37.0  Mode   ASSIST CONTROL  Vt 500  PEEP 5.0  Mechanical Rate 20  Result(s) reported on 27 Aug 2013 at 11:04AM.  General Ref:    18-Jan-15 08:31, Acetaminophen, Serum  Acetaminophen, Serum < 2.0  10-30  POTENTIALLY TOXIC:   > 200 mcg/mL   > 50 mcg/mL at 12 hr after   ingestion   > 300 mcg/mL at 4 hr after   ingestion    41-ULA-45 36:46, Salicylates, Serum  Salicylates, Serum 4.2  0.0-2.8  Therapeutic 2.8-20.0  mg/dL  Toxic >30.0 mg/dL  Routine Chem:    18-Jan-15 08:31, Comprehensive Metabolic Panel  Glucose, Serum 186  BUN 23  Creatinine (comp) 1.56  Sodium, Serum 138  Potassium, Serum 4.0  Chloride, Serum 104  CO2, Serum 21  Calcium (Total), Serum 9.2  Osmolality (calc) 284  eGFR (African American) 42  eGFR (Non-African American) 36  eGFR values <68m/min/1.73 m2 may be an indication of chronic  kidney disease (CKD).  Calculated eGFR is  useful in patients with stable renal function.  The eGFR calculation will not be reliable in acutely ill patients  when serum creatinine is changing rapidly. It is not useful in   patients on dialysis. The eGFR calculation may not be applicable  to patients at the low and high extremes of body sizes, pregnant  women, and vegetarians.  Anion Gap 13    18-Jan-15 08:31, Hemoglobin A1c ([facility])  Hemoglobin A1c ([facility]) 7.8  The American Diabetes Association recommends that a primary goal of  therapy should be <7% and that physicians should reevaluate the  treatment regimen in patients with HbA1c values consistently >8%.  Urine Drugs:    18-Jan-15 08:31, Urine Drug Screen, Qual  Tricyclic Antidepressant, Ur Qual (comp) NEGATIVE  Result(s) reported on 27 Aug 2013 at 10:00AM.  Amphetamines, Urine Qual. NEGATIVE  MDMA, Urine Qual. NEGATIVE  Cocaine Metabolite, Urine Qual. NEGATIVE  Opiate, Urine qual NEGATIVE  Phencyclidine, Urine Qual. NEGATIVE  Cannabinoid, Urine Qual. NEGATIVE  Barbiturates, Urine Qual. NEGATIVE  Benzodiazepine, Urine Qual. NEGATIVE  -----------------  The URINE DRUG SCREEN provides only a preliminary, unconfirmed  analytical test result and should not be used for non-medical   purposes.  Clinical consideration and professional judgment should be   applied to any positive drug screen result due to possible  interfering substances.  A more specific alternate chemical method  must be used in order to obtain a confirmed analytical  result.  Gas  chromatography/mass spectrometry (GC/MS) is the preferred  confirmatory method.  Methadone, Urine Qual. NEGATIVE  Cardiac:    18-Jan-15 08:31, CPK-MB, Serum  CPK-MB, Serum 3.1  Result(s) reported on 27 Aug 2013 at 09:59AM.    18-Jan-15 08:31, Troponin I  Troponin I < 0.02  0.00-0.05  0.05 ng/mL or less: NEGATIVE   Repeat testing in 3-6 hrs   if clinically indicated.  >0.05 ng/mL: POTENTIAL   MYOCARDIAL INJURY. Repeat   testing in 3-6 hrs if   clinically indicated.  NOTE: An increase or decrease   of 30% or more on serial   testing suggests a   clinically important change    18-Jan-15 11:33, CPK-MB, Serum  CPK-MB, Serum 4.1  Result(s) reported on 27 Aug 2013 at 12:35PM.    18-Jan-15 11:33, Troponin I  Troponin I < 0.02  0.00-0.05  0.05 ng/mL or less: NEGATIVE   Repeat testing in 3-6 hrs   if clinically indicated.  >0.05 ng/mL: POTENTIAL   MYOCARDIAL INJURY. Repeat   testing in 3-6 hrs if   clinically indicated.  NOTE: An increase or decrease   of 30% or more on serial   testing suggests a   clinically important change  Routine UA:    18-Jan-15 08:31, Urinalysis  Color (UA) Yellow  Clarity (UA) Clear  Glucose (UA) Negative  Bilirubin (UA) Negative  Ketones (UA) Trace  Specific Gravity (UA) 1.016  Blood (UA) Negative  pH (UA) 5.0  Protein (UA) Negative  Nitrite (UA) Negative  Leukocyte Esterase (UA) Negative  Result(s) reported on 27 Aug 2013 at 09:51AM.  RBC (UA) 6 /HPF  WBC (UA) 4 /HPF  Bacteria (UA)   NONE SEEN  Epithelial Cells (UA) <1 /HPF  Mucous (UA) PRESENT  Hyaline Cast (UA) 6 /LPF  Result(s) reported on 27 Aug 2013 at 09:51AM.  Routine Coag:    18-Jan-15 08:31, Prothrombin Time  Prothrombin 13.9  INR 1.1  INR reference interval applies to patients on anticoagulant therapy.  A single INR therapeutic range for coumarins is not  optimal for all  indications; however, the suggested range for most indications is  2.0 -  3.0.  Exceptions to the INR Reference Range may include: Prosthetic heart  valves, acute myocardial infarction, prevention of myocardial  infarction, and combinations of aspirin and anticoagulant. The need  for a higher or lower target INR must be assessed individually.  Reference: The Pharmacology and Management of the Vitamin K   antagonists: the seventh ACCP Conference on Antithrombotic and  Thrombolytic Therapy. HUOHF.2902 Sept:126 (3suppl): [id number].  A HCT value >55% may artifactually increase the PT.  In one study,   the increase was an average of 25%.  Reference:  "Effect on Routine and Special Coagulation Testing Values  of Citrate Anticoagulant Adjustment in Patients with High HCT Values."  American Journal of Clinical Pathology 2006;126:400-405.  Routine Hem:    18-Jan-15 08:31, Hemogram, Platelet Count  WBC (CBC) 8.0  RBC (CBC) 4.13  Hemoglobin (CBC) 13.4  Hematocrit (CBC) 39.0  Platelet Count (CBC) 209  Result(s) reported on 27 Aug 2013 at 08:50AM.  MCV 94  MCH 32.5  MCHC 34.4  RDW 13.7   RADIOLOGY:  Radiology Results: XRay:    09-Dec-14 23:13, Chest 1 View AP or PA  Chest 1 View AP or PA  REASON FOR EXAM:    Pain  COMMENTS:   May transport without cardiac monitor    PROCEDURE: DXR - DXR CHEST 1 VIEWAP OR PA  - Jul 18 2013 11:13PM     CLINICAL DATA:  Chest pain.  Nausea.  Negative pregnancy test.    EXAM:  CHEST - 1 VIEW    COMPARISON: None.    FINDINGS:  The heart size and mediastinal contours are within normal limits.  Both lungs are clear. The visualized skeletal structures are  unremarkable.     IMPRESSION:  No active disease.      Electronically Signed    By: Shon Hale M.D.    On: 07/18/2013 23:27         Verified By: Glenice Bow, M.D.,    18-Jan-15 09:04, Chest Portable Single View  Chest Portable Single View  REASON FOR EXAM:    intubated, low sat  COMMENTS:       PROCEDURE: DXR - DXR PORTABLE CHEST SINGLE VIEW  - Aug 27 2013  9:04AM     CLINICAL DATA:  Intubation.    EXAM:  PORTABLE CHEST - 1 VIEW    COMPARISON:  07/18/2013    FINDINGS:  Endotracheal tube is 1.3 cm above the carina that pointing towards  the right mainstem bronchus. There are patchy densities throughout  the right mid and lower lung region with volume loss. Findings are  concerning for airspace disease and pneumonia. Right pleural fluid  cannot be excluded. Patchy densities at the left lung base may  represent atelectasis. Heart size is normal. No evidence for a  pneumothorax.     IMPRESSION:  Patchy densities throughout the right chest are concerning for  infection and cannot exclude pleural fluid.    Endotracheal tube is positioned just above the carina as described.      Electronically Signed    By: Markus Daft M.D.    On: 08/27/2013 09:16         Verified By: Burman Riis, M.D.,    18-Jan-15 14:10, Chest Portable Single View  Chest Portable Single View  REASON FOR EXAM:    post central line  COMMENTS:  PROCEDURE: DXR - DXR PORTABLE CHEST SINGLE VIEW  - Aug 27 2013  2:10PM     CLINICAL DATA:  Status post central line placement.    EXAM:  PORTABLE CHEST - 1 VIEW    COMPARISON:  Chest x-ray 08/27/2013.    FINDINGS:  New right internal jugular central venous catheter with tip  terminating in the superior aspect of the right atrium. Endotracheal  tube remains in a low position with tip approximately 1.4 cm above  the carina. Nasogastric tube extends into the stomach, but the tip  is below the lower margin of the image. Extensive airspace  consolidation is noted throughout the right mid to lower lung, with  additional atelectasis and/or consolidation in the medial aspect of  the left lower lobe. Moderate right pleural effusion. No evidence of  pulmonary edema. Heart size is normal. The patient is rotated to the  left on today's exam, resulting in distortion of the mediastinal  contours and reduced  diagnostic sensitivity and specificity for  mediastinal pathology.     IMPRESSION:  1. Support apparatus, as above. No pneumothorax following placement  of right IJ central venous catheter. Endotracheal tube remains in a  low position with tip only 1.4 cm above the carina. This could be  withdrawn approximately 2-3 cm for more optimal placement.  2. Extensive airspace consolidation involving the right middle and  lower lobe with moderate right parapneumonic pleural effusion.  Increasing atelectasis and/or consolidation in the medial aspect of  the left lower lobe.      Electronically Signed    By: Vinnie Langton M.D.    On: 08/27/2013 14:17         Verified By: Etheleen Mayhew, M.D.,  [place]:    09-Dec-14 23:13, Chest 1 View AP or PA  PACS Image    18-Jan-15 09:04, Chest Portable Single View  PACS Image    18-Jan-15 14:10, Chest Portable Single View  PACS Image   ASSESSMENT AND PLAN:  Assessment/Admission Diagnosis Central line will be placed today for hypotension, shock, on pressors and needing secure vascular access.   Electronic Signatures: Su Grand (PA-C)  (Signed 18-Jan-15 15:16)  Authored: Chief Complaint and History, PAST MEDICAL/SURGICAL HISTORY, ALLERGIES, HOME MEDICATIONS, Family and Social History, Review of Systems, Physical Exam, LABS, RADIOLOGY, Assessment and Plan   Last Updated: 18-Jan-15 15:16 by Su Grand (PA-C)
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
## 2015-06-02 IMAGING — CR DG CHEST 1V
1 series · 1 of 1 positions shown · non-contrast
Comparison: None.

CLINICAL DATA: Chest pain.  Nausea.  Negative pregnancy test.

EXAM:
CHEST - 1 VIEW

[ap]
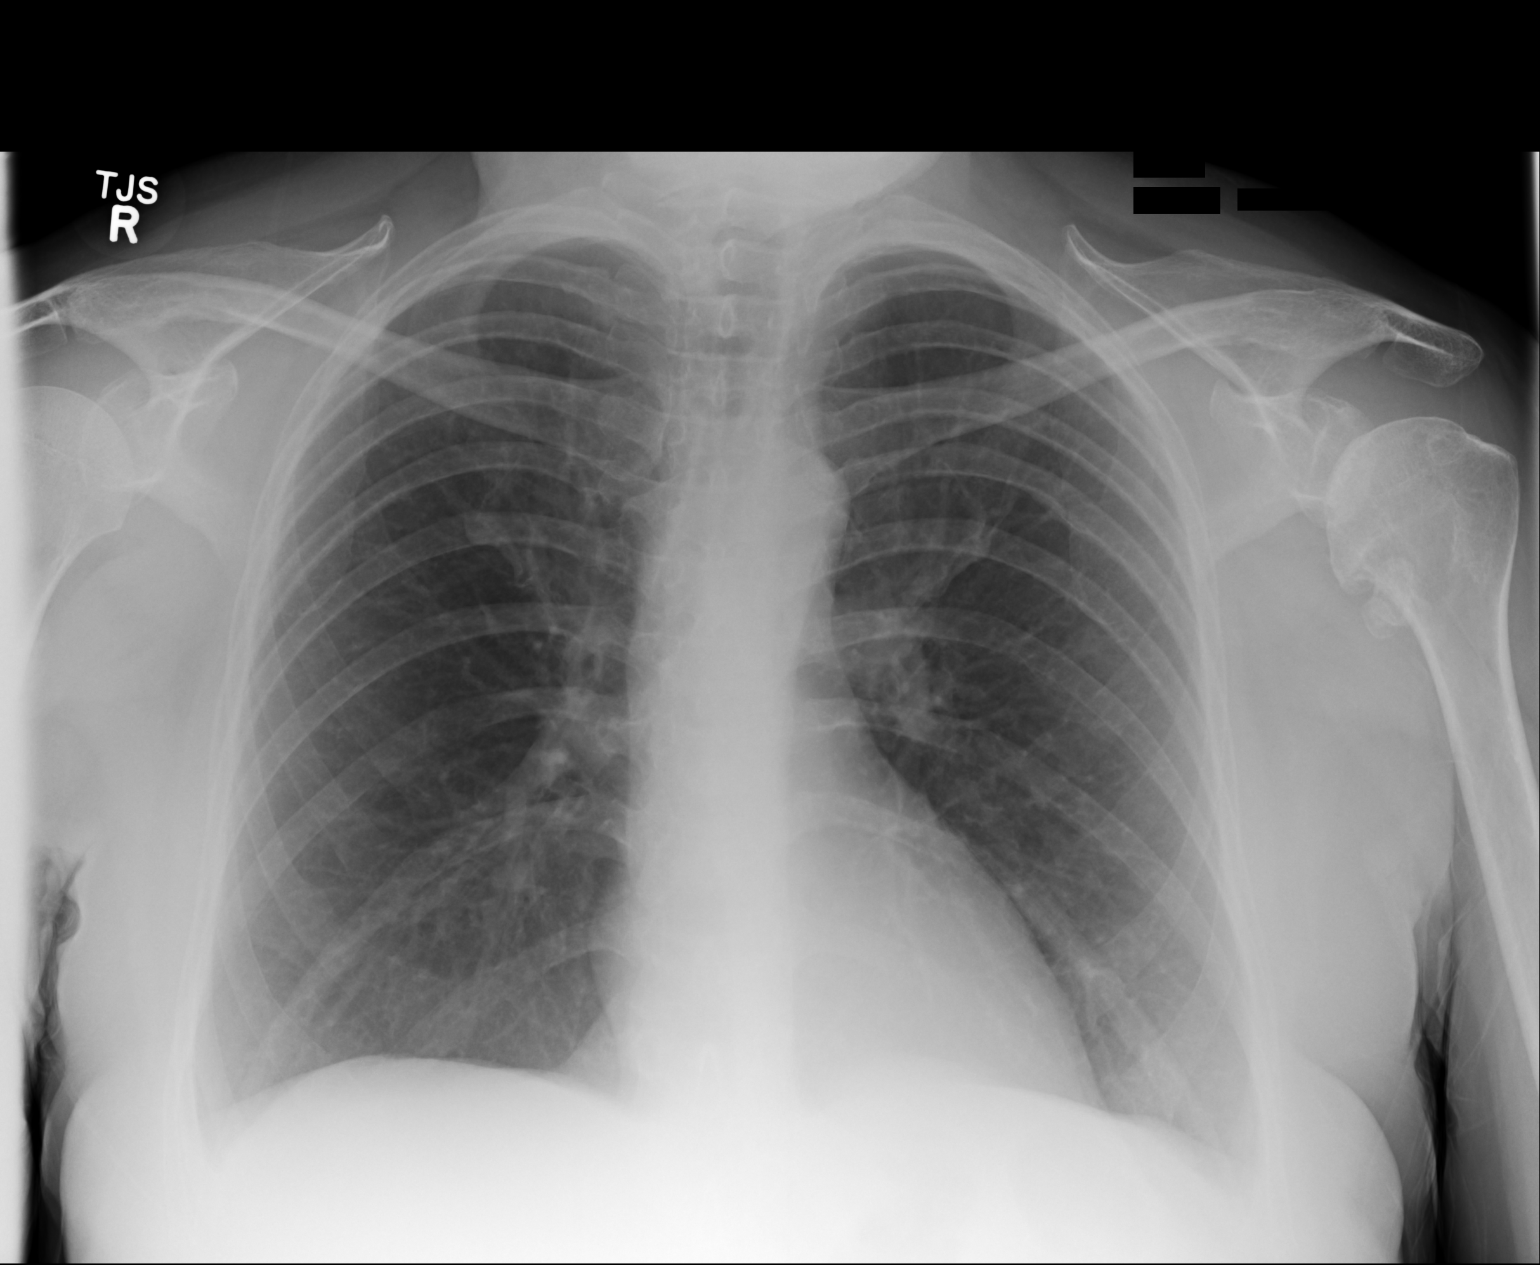

[1 of 1 positions shown; findings below may reference images not displayed]

FINDINGS: The heart size and mediastinal contours are within normal limits.
Both lungs are clear. The visualized skeletal structures are
unremarkable.
IMPRESSION: No active disease.

## 2015-07-12 IMAGING — CR DG CHEST 1V PORT
1 series · 1 of 1 positions shown · non-contrast
Comparison: 07/18/2013

CLINICAL DATA: Intubation.

EXAM:
PORTABLE CHEST - 1 VIEW

[ap]
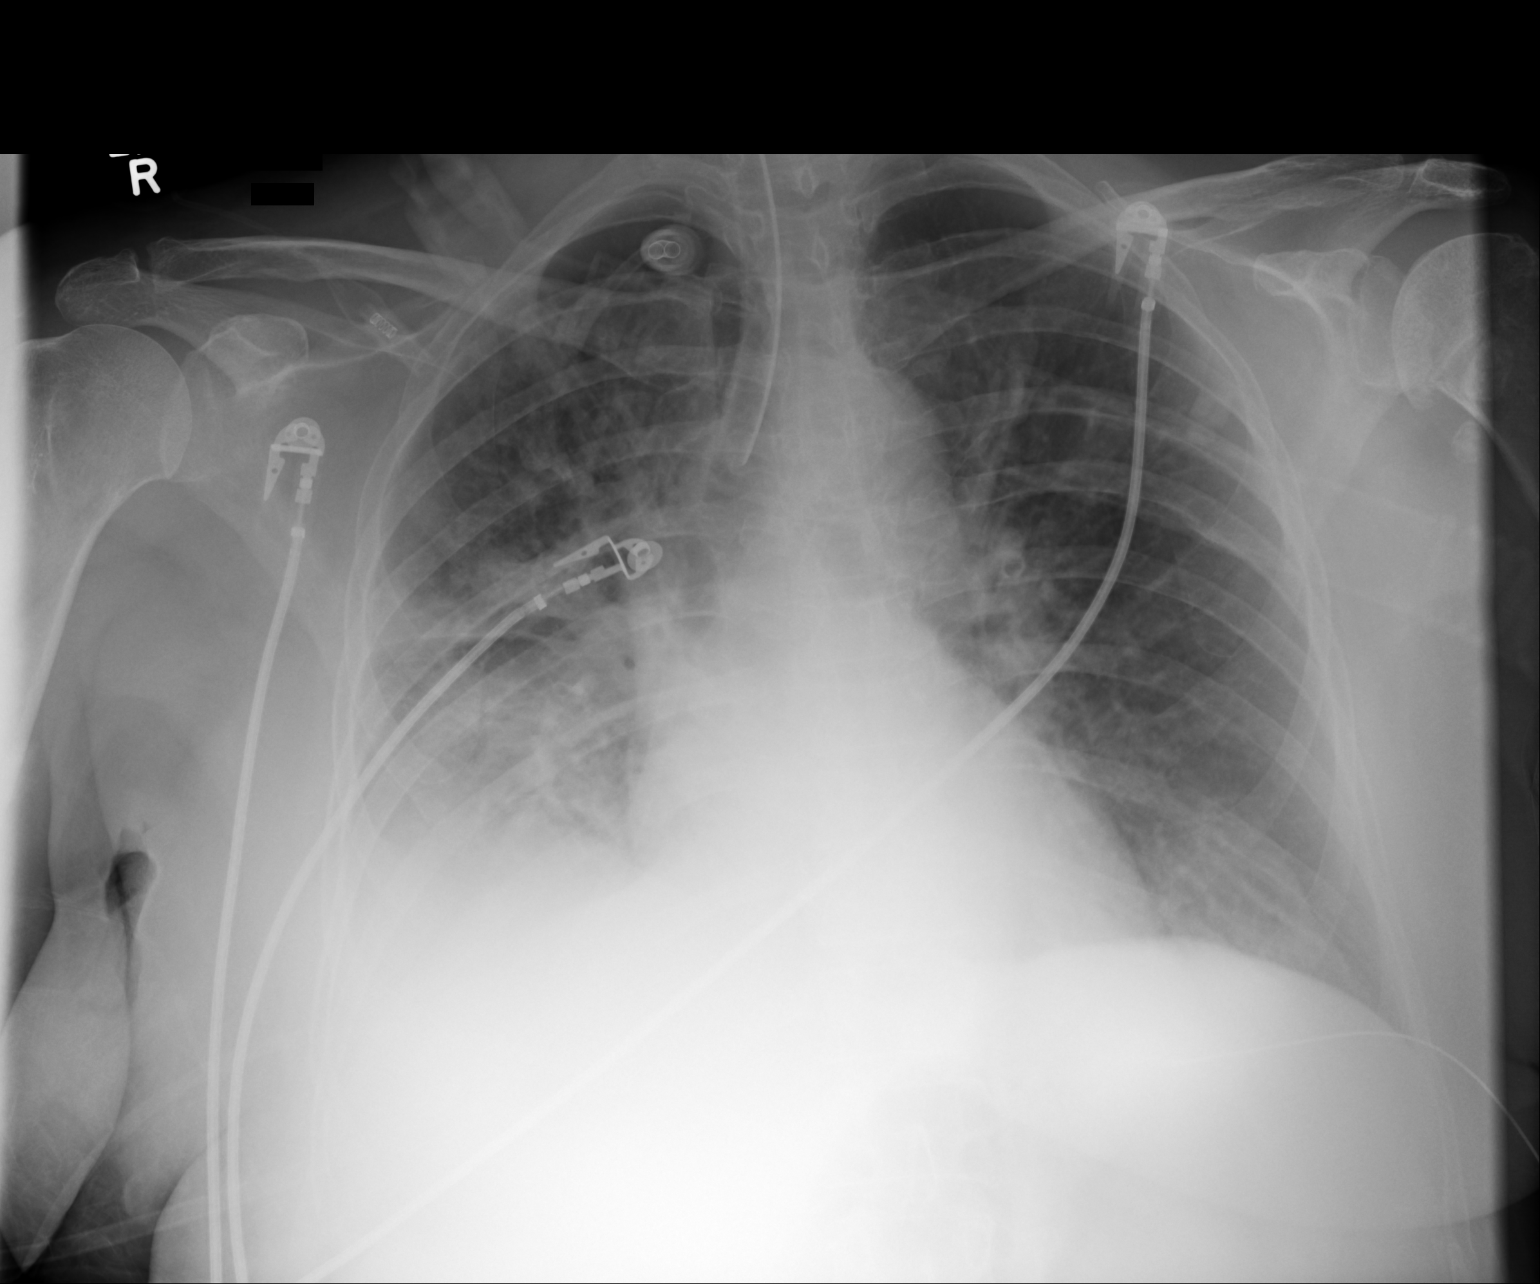

[1 of 1 positions shown; findings below may reference images not displayed]

FINDINGS: Endotracheal tube is 1.3 cm above the carina that pointing towards
the right mainstem bronchus. There are patchy densities throughout
the right mid and lower lung region with volume loss. Findings are
concerning for airspace disease and pneumonia. Right pleural fluid
cannot be excluded. Patchy densities at the left lung base may
represent atelectasis. Heart size is normal. No evidence for a
pneumothorax.
IMPRESSION: Patchy densities throughout the right chest are concerning for
infection and cannot exclude pleural fluid.

Endotracheal tube is positioned just above the carina as described.

## 2015-07-15 IMAGING — CR DG CHEST 1V PORT
1 series · 1 of 1 positions shown · non-contrast
Comparison: Portable chest x-ray at August 29, 2013.

CLINICAL DATA: Respiratory failure, on ventilator

EXAM:
PORTABLE CHEST - 1 VIEW

[ap]
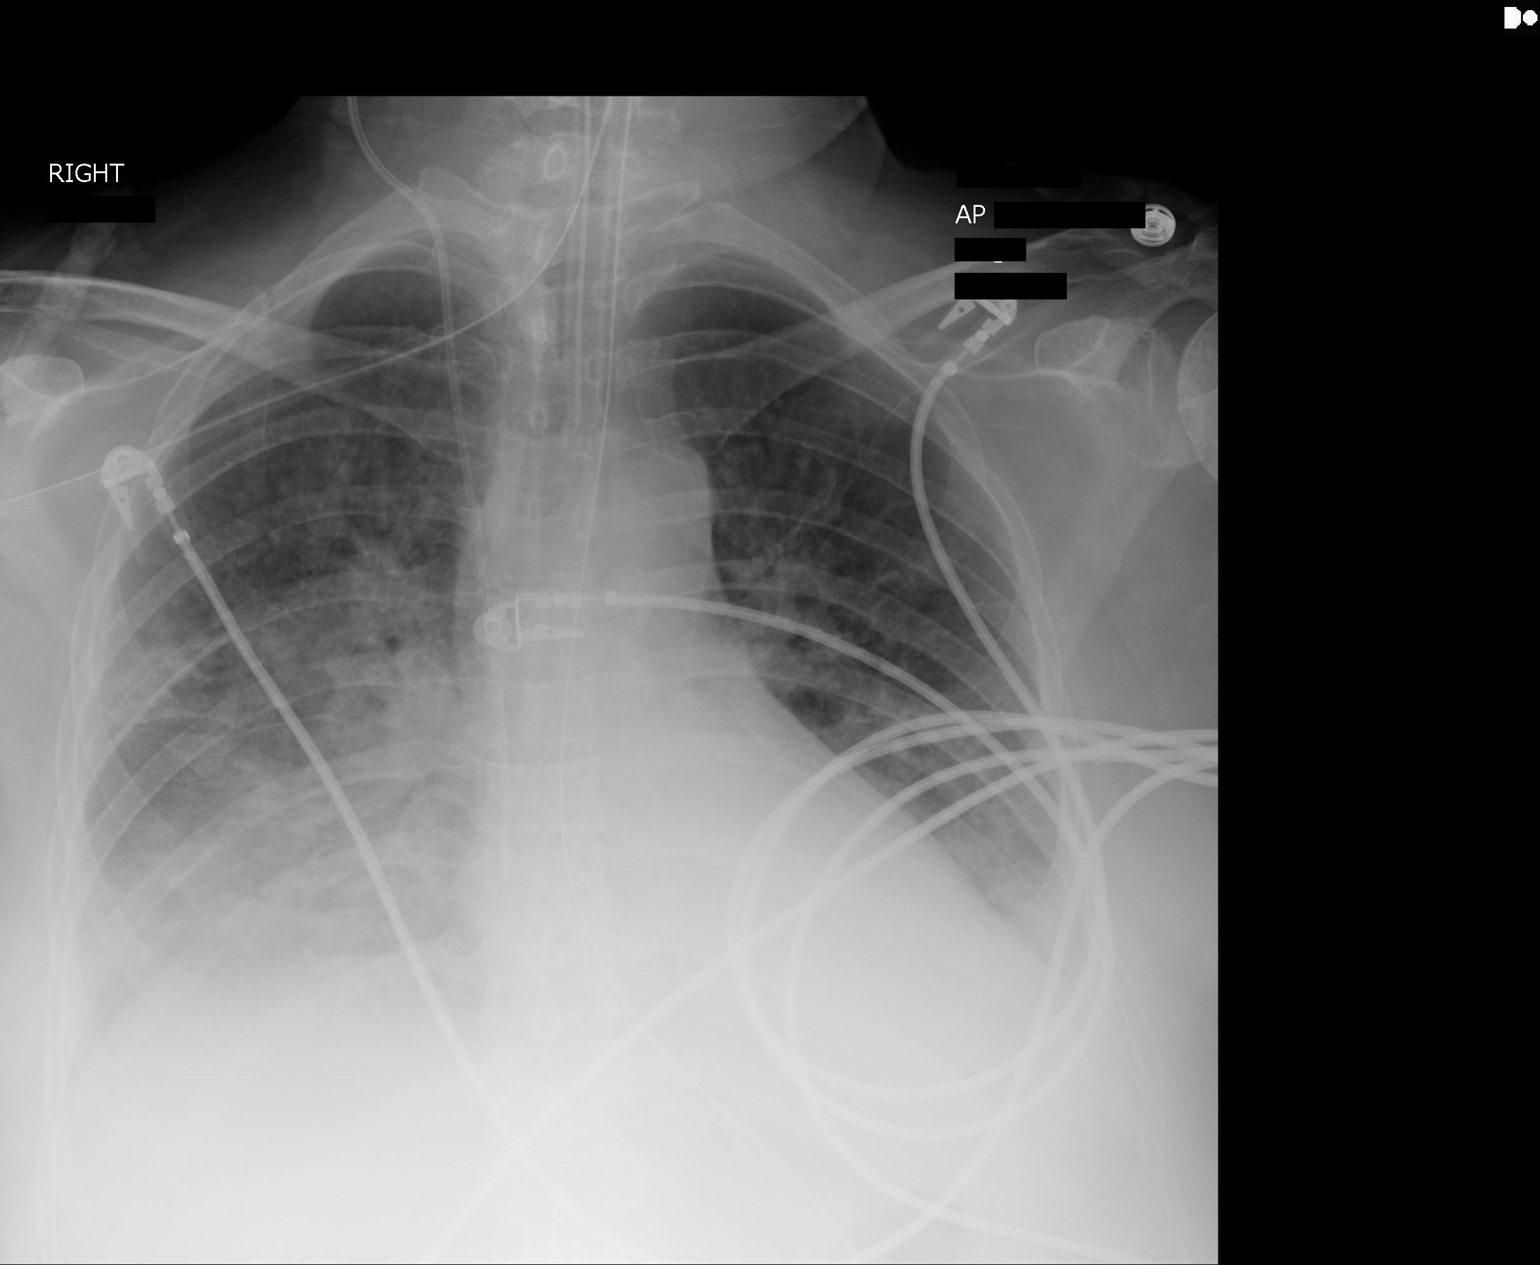

[1 of 1 positions shown; findings below may reference images not displayed]

FINDINGS: The lungs are reasonably well inflated. Persistent increased
interstitial and alveolar density in the right mid and lower lung is
demonstrated. The right hemidiaphragm is less well demonstrated
today. The left hemidiaphragm remains obscured. The retrocardiac
region on the left remains dense. The cardiac silhouette is
top-normal in size. The pulmonary vascularity is prominent
centrally.

The endotracheal tube tip lies approximately 4.5 cm above the crotch
of the carina. The esophagogastric tube tip in proximal port project
off the inferior margin of the film on the left. The right internal
jugular venous catheter tip lies in the region of the junction of
the SVC with the right atrium.
IMPRESSION: 1. Increased density at the right lung base has progressed slightly
since there is now obscuration of the right hemidiaphragm. Left
lower lobe atelectasis and/or pneumonia process.
2. The cardiac silhouette is top-normal in size. The central
pulmonary vascularity remains engorged.
3. The support tubes and lines are in reasonable position.
